# Patient Record
Sex: Male | Born: 1985 | Hispanic: No | Smoking: Former smoker
Health system: Southern US, Community
[De-identification: ages and names within clinical notes are randomized; demographics above are authoritative.]

## PROBLEM LIST (undated history)

## (undated) DIAGNOSIS — I749 Embolism and thrombosis of unspecified artery: Secondary | ICD-10-CM

## (undated) DIAGNOSIS — E079 Disorder of thyroid, unspecified: Secondary | ICD-10-CM

## (undated) DIAGNOSIS — D693 Immune thrombocytopenic purpura: Secondary | ICD-10-CM

## (undated) DIAGNOSIS — A4902 Methicillin resistant Staphylococcus aureus infection, unspecified site: Secondary | ICD-10-CM

## (undated) HISTORY — PX: NO PAST SURGERIES: SHX2092

---
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
## 2007-01-24 ENCOUNTER — Emergency Department (HOSPITAL_COMMUNITY): Admission: EM | Admit: 2007-01-24 | Discharge: 2007-01-24 | Payer: Self-pay | Admitting: Emergency Medicine

## 2007-04-16 DIAGNOSIS — A4902 Methicillin resistant Staphylococcus aureus infection, unspecified site: Secondary | ICD-10-CM

## 2007-04-16 HISTORY — DX: Methicillin resistant Staphylococcus aureus infection, unspecified site: A49.02

## 2007-04-20 ENCOUNTER — Emergency Department (HOSPITAL_COMMUNITY): Admission: EM | Admit: 2007-04-20 | Discharge: 2007-04-20 | Payer: Self-pay | Admitting: Emergency Medicine

## 2007-06-17 ENCOUNTER — Emergency Department (HOSPITAL_COMMUNITY): Admission: EM | Admit: 2007-06-17 | Discharge: 2007-06-17 | Payer: Self-pay | Admitting: Emergency Medicine

## 2009-12-10 ENCOUNTER — Emergency Department (HOSPITAL_COMMUNITY): Admission: EM | Admit: 2009-12-10 | Discharge: 2009-12-10 | Payer: Self-pay | Admitting: Emergency Medicine

## 2009-12-31 ENCOUNTER — Emergency Department (HOSPITAL_COMMUNITY): Admission: EM | Admit: 2009-12-31 | Discharge: 2009-12-31 | Payer: Self-pay | Admitting: Emergency Medicine

## 2010-02-22 ENCOUNTER — Emergency Department (HOSPITAL_COMMUNITY): Admission: EM | Admit: 2010-02-22 | Discharge: 2010-02-22 | Payer: Self-pay | Admitting: Emergency Medicine

## 2010-06-26 LAB — RAPID STREP SCREEN (MED CTR MEBANE ONLY): Streptococcus, Group A Screen (Direct): NEGATIVE

## 2010-06-28 LAB — DIFFERENTIAL
Basophils Absolute: 0.1 10*3/uL (ref 0.0–0.1)
Basophils Relative: 1 % (ref 0–1)
Eosinophils Absolute: 0.1 10*3/uL (ref 0.0–0.7)
Eosinophils Relative: 1 % (ref 0–5)
Lymphocytes Relative: 30 % (ref 12–46)
Lymphs Abs: 2.4 10*3/uL (ref 0.7–4.0)
Monocytes Absolute: 0.7 10*3/uL (ref 0.1–1.0)
Monocytes Relative: 9 % (ref 3–12)
Neutro Abs: 4.8 10*3/uL (ref 1.7–7.7)
Neutrophils Relative %: 59 % (ref 43–77)

## 2010-06-28 LAB — CBC
HCT: 38.2 % — ABNORMAL LOW (ref 39.0–52.0)
Hemoglobin: 12.6 g/dL — ABNORMAL LOW (ref 13.0–17.0)
MCH: 28.3 pg (ref 26.0–34.0)
MCHC: 32.9 g/dL (ref 30.0–36.0)
MCV: 85.9 fL (ref 78.0–100.0)
Platelets: 163 10*3/uL (ref 150–400)
RBC: 4.45 MIL/uL (ref 4.22–5.81)
RDW: 13.2 % (ref 11.5–15.5)
WBC: 8.1 10*3/uL (ref 4.0–10.5)

## 2010-06-28 LAB — POCT CARDIAC MARKERS
CKMB, poc: <1 ng/mL — ABNORMAL LOW (ref 1.0–8.0)
Myoglobin, poc: 95.8 ng/mL (ref 12–200)
Troponin i, poc: <0.05 ng/mL (ref 0.00–0.09)

## 2010-06-28 LAB — URINALYSIS, ROUTINE W REFLEX MICROSCOPIC
Bilirubin Urine: NEGATIVE
Glucose, UA: NEGATIVE mg/dL
Hgb urine dipstick: NEGATIVE
Ketones, ur: NEGATIVE mg/dL
Nitrite: NEGATIVE
Protein, ur: NEGATIVE mg/dL
Specific Gravity, Urine: 1.02 (ref 1.005–1.030)
Urobilinogen, UA: 0.2 mg/dL (ref 0.0–1.0)
pH: 7 (ref 5.0–8.0)

## 2010-06-28 LAB — RAPID URINE DRUG SCREEN, HOSP PERFORMED
Amphetamines: NOT DETECTED
Barbiturates: NOT DETECTED
Benzodiazepines: NOT DETECTED
Cocaine: NOT DETECTED
Opiates: POSITIVE — AB
Tetrahydrocannabinol: POSITIVE — AB

## 2010-06-28 LAB — BASIC METABOLIC PANEL
BUN: 8 mg/dL (ref 6–23)
CO2: 24 mEq/L (ref 19–32)
Calcium: 9.8 mg/dL (ref 8.4–10.5)
Chloride: 106 mEq/L (ref 96–112)
Creatinine, Ser: 1.17 mg/dL (ref 0.4–1.5)
GFR calc Af Amer: >60 mL/min (ref >60)
GFR calc non Af Amer: >60 mL/min (ref >60)
Glucose, Bld: 87 mg/dL (ref 70–99)
Potassium: 3.6 mEq/L (ref 3.5–5.1)
Sodium: 141 mEq/L (ref 135–145)

## 2010-06-28 LAB — RAPID STREP SCREEN (MED CTR MEBANE ONLY): Streptococcus, Group A Screen (Direct): NEGATIVE

## 2010-06-28 LAB — D-DIMER, QUANTITATIVE: D-Dimer, Quant: 1.06 ug/mL-FEU — ABNORMAL HIGH (ref 0.00–0.48)

## 2010-08-31 NOTE — Procedures (Signed)
   NAME:  Nathan Williamson, Nathan Williamson                           ACCOUNT NO.:  [id number]   MEDICAL RECORD NO.:  [id number]                   PATIENT TYPE:  OUT   LOCATION:  RAD                                  FACILITY:  [facility]   PHYSICIAN:  Vida Roller, M.D.                DATE OF BIRTH:  [birth year]   DATE OF PROCEDURE:  DATE OF DISCHARGE:                                  ECHOCARDIOGRAM   TAPE NUMBER:  [id number]   TAPE COUNT:  [id number]   CLINICAL INFORMATION:  This is an 25 year old with chest pain.   TECHNICAL QUALITY:  Excellent.   M-MODE TRACINGS:  1. The aorta is 28 mm.  2. The left atrium is 36 mm.  3. The septum is 9 mm.  4. The posterior wall is 10 mm.  5. The left ventricular diastolic dimension is 49 mm.  6. The left ventricular systolic dimension is 32 mm.   2-D AND DOPPLER IMAGING:  1. The left ventricle is normal size with normal systolic and diastolic     function.  No wall motion abnormalities are seen.  There is no evidence     of hypertrophy.  2. The right ventricle is normal size with normal systolic function.  No     wall motion abnormalities are seen.  3. Both atria are normal size. There is no evidence of an atrioseptal     defect.  4. The aortic valve is trileaflet and tricommisural with no evidence of     stenosis or regurgitation.  5. The mitral valve is morphologically unremarkable with no stenosis or     regurgitation.  6. The tricuspid valve is morphologically unremarkable with trace     insufficiency.  No stenosis is seen.  7. The pulmonic valve is morphologically unremarkable with trace     insufficiency.  No stenosis is seen.  8. The ascending aorta is normal.  The aortic arch is normal.  The     descending aorta to the limits of the study is normal.  9. The pericardial structures are normal.  10.      The inferior vena cava is normal size.      ___________________________________________                                            Vida Roller,  M.D.   JH/MEDQ  D:  01/05/2003  T:  01/05/2003  Job:  [id number]

## 2010-09-16 ENCOUNTER — Emergency Department (HOSPITAL_COMMUNITY)
Admission: EM | Admit: 2010-09-16 | Discharge: 2010-09-17 | Disposition: A | Discharge status: Home / Self Care | Payer: Self-pay | Attending: Emergency Medicine | Admitting: Emergency Medicine

## 2010-09-16 ENCOUNTER — Emergency Department (HOSPITAL_COMMUNITY): Payer: Self-pay

## 2010-09-16 DIAGNOSIS — IMO0002 Reserved for concepts with insufficient information to code with codable children: Secondary | ICD-10-CM | POA: Insufficient documentation

## 2010-09-16 DIAGNOSIS — M25469 Effusion, unspecified knee: Secondary | ICD-10-CM | POA: Insufficient documentation

## 2010-09-16 DIAGNOSIS — D693 Immune thrombocytopenic purpura: Secondary | ICD-10-CM | POA: Insufficient documentation

## 2010-09-16 DIAGNOSIS — J45909 Unspecified asthma, uncomplicated: Secondary | ICD-10-CM | POA: Insufficient documentation

## 2010-09-16 DIAGNOSIS — E039 Hypothyroidism, unspecified: Secondary | ICD-10-CM | POA: Insufficient documentation

## 2010-09-16 DIAGNOSIS — W1789XA Other fall from one level to another, initial encounter: Secondary | ICD-10-CM | POA: Insufficient documentation

## 2010-09-16 DIAGNOSIS — Z79899 Other long term (current) drug therapy: Secondary | ICD-10-CM | POA: Insufficient documentation

## 2010-10-29 ENCOUNTER — Emergency Department (HOSPITAL_COMMUNITY)
Admission: EM | Admit: 2010-10-29 | Discharge: 2010-10-30 | Disposition: A | Discharge status: Home / Self Care | Payer: Self-pay | Attending: Emergency Medicine | Admitting: Emergency Medicine

## 2010-10-29 ENCOUNTER — Emergency Department (HOSPITAL_COMMUNITY): Payer: Self-pay

## 2010-10-29 ENCOUNTER — Encounter: Payer: Self-pay | Admitting: *Deleted

## 2010-10-29 ENCOUNTER — Other Ambulatory Visit: Payer: Self-pay

## 2010-10-29 DIAGNOSIS — R0789 Other chest pain: Secondary | ICD-10-CM | POA: Insufficient documentation

## 2010-10-29 DIAGNOSIS — R0602 Shortness of breath: Secondary | ICD-10-CM | POA: Insufficient documentation

## 2010-10-29 DIAGNOSIS — Z86718 Personal history of other venous thrombosis and embolism: Secondary | ICD-10-CM | POA: Insufficient documentation

## 2010-10-29 DIAGNOSIS — R110 Nausea: Secondary | ICD-10-CM | POA: Insufficient documentation

## 2010-10-29 DIAGNOSIS — I499 Cardiac arrhythmia, unspecified: Secondary | ICD-10-CM | POA: Insufficient documentation

## 2010-10-29 DIAGNOSIS — R209 Unspecified disturbances of skin sensation: Secondary | ICD-10-CM | POA: Insufficient documentation

## 2010-10-29 DIAGNOSIS — F17200 Nicotine dependence, unspecified, uncomplicated: Secondary | ICD-10-CM | POA: Insufficient documentation

## 2010-10-29 DIAGNOSIS — R61 Generalized hyperhidrosis: Secondary | ICD-10-CM | POA: Insufficient documentation

## 2010-10-29 HISTORY — DX: Immune thrombocytopenic purpura: D69.3

## 2010-10-29 HISTORY — DX: Embolism and thrombosis of unspecified artery: I74.9

## 2010-10-29 HISTORY — DX: Disorder of thyroid, unspecified: E07.9

## 2010-10-29 LAB — CBC
HCT: 34.4 % — ABNORMAL LOW (ref 39.0–52.0)
MCHC: 33.4 g/dL (ref 30.0–36.0)
Platelets: 186 10*3/uL (ref 150–400)
RDW: 13.3 % (ref 11.5–15.5)
WBC: 6.3 10*3/uL (ref 4.0–10.5)

## 2010-10-29 LAB — COMPREHENSIVE METABOLIC PANEL
AST: 22 U/L (ref 0–37)
Albumin: 4 g/dL (ref 3.5–5.2)
Alkaline Phosphatase: 64 U/L (ref 39–117)
BUN: 14 mg/dL (ref 6–23)
Creatinine, Ser: 1.09 mg/dL (ref 0.50–1.35)
Potassium: 4.4 mEq/L (ref 3.5–5.1)
Total Protein: 7.4 g/dL (ref 6.0–8.3)

## 2010-10-29 LAB — RAPID URINE DRUG SCREEN, HOSP PERFORMED
Amphetamines: NOT DETECTED
Barbiturates: NOT DETECTED
Cocaine: NOT DETECTED
Opiates: NOT DETECTED
Tetrahydrocannabinol: POSITIVE — AB

## 2010-10-29 LAB — APTT: aPTT: 30 seconds (ref 24–37)

## 2010-10-29 LAB — PROTIME-INR: Prothrombin Time: 13.6 seconds (ref 11.6–15.2)

## 2010-10-29 MED ORDER — KETOROLAC TROMETHAMINE 30 MG/ML IJ SOLN
30.0000 mg | Freq: Once | INTRAMUSCULAR | Status: AC
  Administered 2010-10-29: 30 mg via INTRAVENOUS
  Filled 2010-10-29: qty 1

## 2010-10-29 MED ORDER — ONDANSETRON HCL 4 MG/2ML IJ SOLN
4.0000 mg | Freq: Four times a day (QID) | INTRAMUSCULAR | Status: DC | PRN
  Administered 2010-10-29: 4 mg via INTRAVENOUS
  Filled 2010-10-29: qty 2

## 2010-10-29 NOTE — ED Provider Notes (Signed)
History     Chief Complaint  Patient presents with  . Chest Pain    left arm numbness   Patient is a 25 y.o. male presenting with chest pain. The history is provided by the patient.  Chest Pain The chest pain began 1 - 2 hours ago. Chest pain occurs constantly. The chest pain is unchanged. The pain is associated with exertion and lifting (Pt reports moving furniture this afternoon when he became hot and chest pain began, pt went home and lied down but pain persisted.). The quality of the pain is described as sharp and squeezing (started as sharp pain but has become a squeezing pain). The pain does not radiate (pain does not radiate, but c/o numbness in his LUE). Exacerbated by: nothing. Primary symptoms include shortness of breath and nausea. Pertinent negatives for primary symptoms include no fever, no syncope, no cough, no wheezing, no palpitations, no abdominal pain, no vomiting and no dizziness.  Associated symptoms include diaphoresis and numbness.  Pertinent negatives for associated symptoms include no weakness. He tried nothing for the symptoms. Risk factors include smoking/tobacco exposure and male gender. Past medical history comments: Pt reports similar pain 3 months ago, seen in ED at that time, dx with "blood clot between heart and lungs" given med injection in his hip and d/c, no known blood thinners. ITP.     Past Medical History  Diagnosis Date  . Embolism - blood clot   . Thyroid disease   . ITP (idiopathic thrombocytopenic purpura)     History reviewed. No pertinent past surgical history.  History reviewed. No pertinent family history.  History  Substance Use Topics  . Smoking status: Current Everyday Smoker -- 0.5 packs/day    Types: Cigarettes  . Smokeless tobacco: Not on file  . Alcohol Use: No      Review of Systems  Constitutional: Positive for diaphoresis. Negative for fever.  Respiratory: Positive for shortness of breath. Negative for cough and wheezing.    Cardiovascular: Positive for chest pain. Negative for palpitations and syncope.  Gastrointestinal: Positive for nausea. Negative for vomiting and abdominal pain.  Neurological: Positive for numbness. Negative for dizziness and weakness.  All other systems reviewed and are negative.    Physical Exam  BP 125/66  Pulse 95  Temp(Src) 98.5 F (36.9 C) (Oral)  Resp 20  Ht 5' 9.5" (1.765 m)  Wt 180 lb (81.647 kg)  BMI 26.20 kg/m2  SpO2 100%  Physical Exam  Constitutional: He is oriented to person, place, and time. He appears well-developed and well-nourished.  Non-toxic appearance. He does not appear ill. No distress.  HENT:  Head: Normocephalic and atraumatic.  Right Ear: External ear normal.  Left Ear: External ear normal.  Nose: Nose normal. No mucosal edema or rhinorrhea.  Mouth/Throat: Oropharynx is clear and moist and mucous membranes are normal. No dental abscesses or uvula swelling.  Eyes: Conjunctivae and EOM are normal. Pupils are equal, round, and reactive to light.  Neck: Normal range of motion and full passive range of motion without pain. Neck supple.  Cardiovascular: Normal rate, regular rhythm and normal heart sounds.  Exam reveals no gallop and no friction rub.   No murmur heard. Pulmonary/Chest: Effort normal and breath sounds normal. No respiratory distress. He has no wheezes. He has no rhonchi. He has no rales. He exhibits no tenderness and no crepitus.  Abdominal: Soft. Normal appearance and bowel sounds are normal. He exhibits no distension. There is no tenderness. There is no rebound  and no guarding.  Musculoskeletal: Normal range of motion. He exhibits no edema and no tenderness.       Moves all extremities well.   Neurological: He is alert and oriented to person, place, and time. He has normal strength. No cranial nerve deficit.  Skin: Skin is warm, dry and intact. No rash noted. No erythema. No pallor.  Psychiatric: He has a normal mood and affect. His speech  is normal and behavior is normal. His mood appears not anxious.    ED Course  Procedures  Written by Enos Fling acting as scribe for Dr. Lynelle Doctor. I personally performed the services described in this documentation, which was scribed in my presence. The recorded information has been reviewed and considered. Devoria Albe, MD, FACEP  MDM   Date: 10/29/2010  Rate: 56  Rhythm: sinus bradycardia and sinus arrhythmia  QRS Axis: normal  Intervals: normal  ST/T Wave abnormalities: nonspecific ST/T changes  Conduction Disutrbances:none  Narrative Interpretation: early repolarization  Old EKG Reviewed: unchanged from 12/31/2009   Reviewed prior records pt had CT angio chest in Sept 2011 showing goiter, no PE and had a CT angio in 2004 that was negative for PE. PT had echo in May that was normal.   12:43 AM Pt insists he had a PE (blood clot between his heart and lung). States he had the tests done here. I reviewed canopy and he has had 3 CT angio chest one in 2011 and two in 2004 and all are neg for PE.   Pt given toradol for pain, states he is still hurting, will add muscle relaxer and let him go home.       Ward Givens, MD 10/30/10 [phone]

## 2010-10-29 NOTE — ED Notes (Signed)
Patient with Chest pain that started today and left arm numbness today, patient with hx of blood clot in chest-?PE-in April of this year

## 2010-10-30 MED ORDER — CYCLOBENZAPRINE HCL 10 MG PO TABS: 10.0000 mg | ORAL_TABLET | Freq: Three times a day (TID) | ORAL | Status: AC | PRN

## 2010-10-30 MED ORDER — TRAMADOL-ACETAMINOPHEN 37.5-325 MG PO TABS: 2.0000 | ORAL_TABLET | Freq: Four times a day (QID) | ORAL | Status: AC | PRN

## 2010-10-30 MED ORDER — CYCLOBENZAPRINE HCL 10 MG PO TABS
10.0000 mg | ORAL_TABLET | Freq: Once | ORAL | Status: AC
  Administered 2010-10-30: 10 mg via ORAL
  Filled 2010-10-30: qty 1

## 2010-10-30 MED ORDER — IBUPROFEN 600 MG PO TABS
600.0000 mg | ORAL_TABLET | Freq: Four times a day (QID) | ORAL | Status: AC | PRN
Start: 2010-10-30 — End: 2010-11-09

## 2010-10-30 NOTE — ED Notes (Signed)
Pt c/o pain; Dr. Lynelle Doctor made aware, no new orders given

## 2010-12-09 ENCOUNTER — Observation Stay (HOSPITAL_COMMUNITY)
Admission: EM | Admit: 2010-12-09 | Discharge: 2010-12-11 | Disposition: A | Discharge status: Home / Self Care | Payer: Self-pay | Attending: Internal Medicine | Admitting: Internal Medicine

## 2010-12-09 ENCOUNTER — Other Ambulatory Visit: Payer: Self-pay

## 2010-12-09 ENCOUNTER — Encounter (HOSPITAL_COMMUNITY): Payer: Self-pay | Admitting: Emergency Medicine

## 2010-12-09 ENCOUNTER — Emergency Department (HOSPITAL_COMMUNITY): Payer: Self-pay

## 2010-12-09 DIAGNOSIS — R079 Chest pain, unspecified: Secondary | ICD-10-CM | POA: Diagnosis present

## 2010-12-09 DIAGNOSIS — R001 Bradycardia, unspecified: Secondary | ICD-10-CM | POA: Diagnosis present

## 2010-12-09 DIAGNOSIS — R748 Abnormal levels of other serum enzymes: Secondary | ICD-10-CM | POA: Diagnosis present

## 2010-12-09 DIAGNOSIS — E010 Iodine-deficiency related diffuse (endemic) goiter: Secondary | ICD-10-CM

## 2010-12-09 DIAGNOSIS — R0789 Other chest pain: Principal | ICD-10-CM | POA: Insufficient documentation

## 2010-12-09 DIAGNOSIS — I498 Other specified cardiac arrhythmias: Secondary | ICD-10-CM | POA: Insufficient documentation

## 2010-12-09 DIAGNOSIS — E049 Nontoxic goiter, unspecified: Secondary | ICD-10-CM | POA: Insufficient documentation

## 2010-12-09 DIAGNOSIS — E876 Hypokalemia: Secondary | ICD-10-CM | POA: Insufficient documentation

## 2010-12-09 HISTORY — DX: Methicillin resistant Staphylococcus aureus infection, unspecified site: A49.02

## 2010-12-09 LAB — URINALYSIS, ROUTINE W REFLEX MICROSCOPIC
Glucose, UA: NEGATIVE mg/dL
Glucose, UA: NEGATIVE mg/dL
Hgb urine dipstick: NEGATIVE
Ketones, ur: 40 mg/dL — AB
Leukocytes, UA: NEGATIVE
Leukocytes, UA: NEGATIVE
Nitrite: NEGATIVE
Protein, ur: NEGATIVE mg/dL
Protein, ur: NEGATIVE mg/dL
Specific Gravity, Urine: 1.01 (ref 1.005–1.030)
pH: 7 (ref 5.0–8.0)
pH: 6 (ref 5.0–8.0)

## 2010-12-09 LAB — DIFFERENTIAL
Basophils Absolute: 0 10*3/uL (ref 0.0–0.1)
Basophils Relative: 0 % (ref 0–1)
Eosinophils Absolute: 0.1 10*3/uL (ref 0.0–0.7)
Eosinophils Relative: 2 % (ref 0–5)
Monocytes Absolute: 0.6 10*3/uL (ref 0.1–1.0)
Monocytes Relative: 9 % (ref 3–12)
Neutrophils Relative %: 53 % (ref 43–77)

## 2010-12-09 LAB — APTT: aPTT: 32 seconds (ref 24–37)

## 2010-12-09 LAB — CBC
Platelets: 210 10*3/uL (ref 150–400)
RBC: 4.09 MIL/uL — ABNORMAL LOW (ref 4.22–5.81)
WBC: 7.3 10*3/uL (ref 4.0–10.5)

## 2010-12-09 LAB — MRSA PCR SCREENING: MRSA by PCR: INVALID — AB

## 2010-12-09 LAB — COMPREHENSIVE METABOLIC PANEL
AST: 23 U/L (ref 0–37)
Albumin: 3.7 g/dL (ref 3.5–5.2)
BUN: 16 mg/dL (ref 6–23)
Calcium: 8.8 mg/dL (ref 8.4–10.5)
Creatinine, Ser: 0.93 mg/dL (ref 0.50–1.35)
Total Bilirubin: 0.6 mg/dL (ref 0.3–1.2)

## 2010-12-09 LAB — PROTIME-INR: INR: 1.08 (ref 0.00–1.49)

## 2010-12-09 LAB — CARDIAC PANEL(CRET KIN+CKTOT+MB+TROPI)
Relative Index: 4.7 — ABNORMAL HIGH (ref 0.0–2.5)
Total CK: 669 U/L — ABNORMAL HIGH (ref 7–232)

## 2010-12-09 LAB — LIPASE, BLOOD: Lipase: 99 U/L — ABNORMAL HIGH (ref 11–59)

## 2010-12-09 MED ORDER — IOHEXOL 350 MG/ML SOLN
100.0000 mL | Freq: Once | INTRAVENOUS | Status: AC | PRN
  Administered 2010-12-09: 100 mL via INTRAVENOUS

## 2010-12-09 MED ORDER — HYDROMORPHONE HCL 1 MG/ML IJ SOLN
0.5000 mg | INTRAMUSCULAR | Status: DC | PRN
  Administered 2010-12-10 (×2): 1 mg via INTRAVENOUS
  Administered 2010-12-10: 0.5 mg via INTRAVENOUS
  Filled 2010-12-09 (×4): qty 1

## 2010-12-09 MED ORDER — LEVOTHYROXINE SODIUM 75 MCG PO TABS
175.0000 ug | ORAL_TABLET | Freq: Every day | ORAL | Status: DC
  Administered 2010-12-10 – 2010-12-11 (×2): 175 ug via ORAL
  Filled 2010-12-09 (×2): qty 1

## 2010-12-09 MED ORDER — MORPHINE SULFATE 4 MG/ML IJ SOLN
4.0000 mg | Freq: Once | INTRAMUSCULAR | Status: AC
  Administered 2010-12-09: 4 mg via INTRAVENOUS
  Filled 2010-12-09: qty 1

## 2010-12-09 MED ORDER — SODIUM CHLORIDE 0.9 % IV SOLN: INTRAVENOUS | Status: DC

## 2010-12-09 MED ORDER — ONDANSETRON HCL 4 MG/2ML IJ SOLN: 4.0000 mg | Freq: Four times a day (QID) | INTRAMUSCULAR | Status: DC | PRN

## 2010-12-09 MED ORDER — ONDANSETRON HCL 4 MG/2ML IJ SOLN
4.0000 mg | Freq: Once | INTRAMUSCULAR | Status: AC
  Administered 2010-12-09: 4 mg via INTRAVENOUS
  Filled 2010-12-09: qty 2

## 2010-12-09 MED ORDER — ASPIRIN EC 81 MG PO TBEC
81.0000 mg | DELAYED_RELEASE_TABLET | Freq: Every day | ORAL | Status: DC
  Administered 2010-12-10 – 2010-12-11 (×2): 81 mg via ORAL
  Filled 2010-12-09 (×2): qty 1

## 2010-12-09 MED ORDER — POTASSIUM CHLORIDE IN NACL 20-0.9 MEQ/L-% IV SOLN
INTRAVENOUS | Status: DC
  Administered 2010-12-09 – 2010-12-11 (×5): via INTRAVENOUS

## 2010-12-09 MED ORDER — SODIUM CHLORIDE 0.9 % IV SOLN
Freq: Once | INTRAVENOUS | Status: AC
  Administered 2010-12-09: 23:00:00 via INTRAVENOUS

## 2010-12-09 MED ORDER — ENOXAPARIN SODIUM 40 MG/0.4ML ~~LOC~~ SOLN
40.0000 mg | SUBCUTANEOUS | Status: DC
  Administered 2010-12-09 – 2010-12-10 (×2): 40 mg via SUBCUTANEOUS
  Filled 2010-12-09 (×2): qty 0.4

## 2010-12-09 MED ORDER — ACETAMINOPHEN 650 MG RE SUPP: 650.0000 mg | Freq: Four times a day (QID) | RECTAL | Status: DC | PRN

## 2010-12-09 MED ORDER — POLYETHYLENE GLYCOL 3350 17 G PO PACK: 17.0000 g | PACK | Freq: Every day | ORAL | Status: DC | PRN

## 2010-12-09 MED ORDER — ONDANSETRON HCL 4 MG PO TABS: 4.0000 mg | ORAL_TABLET | Freq: Four times a day (QID) | ORAL | Status: DC | PRN

## 2010-12-09 MED ORDER — ACETAMINOPHEN 325 MG PO TABS: 650.0000 mg | ORAL_TABLET | Freq: Four times a day (QID) | ORAL | Status: DC | PRN

## 2010-12-09 MED ORDER — BISACODYL 10 MG RE SUPP: 10.0000 mg | RECTAL | Status: DC | PRN

## 2010-12-09 MED ORDER — FLEET ENEMA 7-19 GM/118ML RE ENEM: 1.0000 | ENEMA | RECTAL | Status: DC | PRN

## 2010-12-09 MED ORDER — TRAZODONE HCL 50 MG PO TABS: 25.0000 mg | ORAL_TABLET | Freq: Every evening | ORAL | Status: DC | PRN

## 2010-12-09 NOTE — ED Notes (Signed)
Pt advised of possible admission. Pt given a drink at this time.

## 2010-12-09 NOTE — ED Notes (Signed)
Pt remains on cardiac monitor w/ NIBP vital signs WNL. Bed in low position, side rails up x2. NAD noted & no needs voiced at this time. Pt states his chest pain is better at this time & rates his pain a 5/10.

## 2010-12-09 NOTE — ED Notes (Signed)
Pt states he woke up with chest pain midsternal around 10am. Also reports SOB since onset

## 2010-12-09 NOTE — ED Notes (Signed)
CRITICAL VALUE ALERT  Critical value received: mb 31.2  Date of notification:  12/09/2010   Time of notification: 19:01  Critical value read back: yes Nurse who received alert:  Juliette Alcide, RN   MD notified (1st page):  Dr. Lynelle Doctor   Time of first page: 19:02  MD notified (2nd page):  Time of second page:  Responding MD:    Time MD responded:

## 2010-12-09 NOTE — ED Provider Notes (Signed)
History     CSN: [id number] Arrival date & time: 12/09/2010  2:55 PM  Chief Complaint  Patient presents with  . Chest Pain   HPI Comments: The pain is located in the bilateral chest. It does increase with palpation and movement.  Patient is a 25 y.o. male presenting with chest pain. The history is provided by the patient.  Chest Pain Episode onset: today. Chest pain occurs constantly. The chest pain is unchanged. The severity of the pain is moderate. Quality: She feels like it's a grabbing in his chest. The pain does not radiate. Chest pain is worsened by certain positions. Primary symptoms include shortness of breath, nausea, vomiting and dizziness. Pertinent negatives for primary symptoms include no fever, no fatigue, no syncope, no cough and no wheezing.  The shortness of breath began today. The shortness of breath is mild. The patient's medical history does not include CHF, COPD, asthma or chronic lung disease.   Dizziness also occurs with nausea and vomiting. Dizziness does not occur with weakness or diaphoresis.   Pertinent negatives for associated symptoms include no diaphoresis, no numbness and no weakness.     Past Medical History  Diagnosis Date  . Embolism - blood clot   . Thyroid disease   . ITP (idiopathic thrombocytopenic purpura)     History reviewed. No pertinent past surgical history.  History reviewed. No pertinent family history.  History  Substance Use Topics  . Smoking status: Current Everyday Smoker -- 0.5 packs/day    Types: Cigarettes  . Smokeless tobacco: Not on file  . Alcohol Use: No      Review of Systems  Constitutional: Negative for fever, diaphoresis and fatigue.  Respiratory: Positive for shortness of breath. Negative for cough and wheezing.   Cardiovascular: Positive for chest pain. Negative for syncope.  Gastrointestinal: Positive for nausea and vomiting.  Neurological: Positive for dizziness. Negative for weakness and numbness.  All  other systems reviewed and are negative.    Physical Exam  BP 118/57  Pulse 45  Temp(Src) 98.5 F (36.9 C) (Oral)  Resp 18  SpO2 100%  Physical Exam  Nursing note and vitals reviewed. Constitutional: He appears well-developed and well-nourished. No distress.       Uncomfortable appearing  HENT:  Head: Normocephalic and atraumatic.  Right Ear: External ear normal.  Left Ear: External ear normal.  Eyes: Conjunctivae are normal. Right eye exhibits no discharge. Left eye exhibits no discharge. No scleral icterus.  Neck: Neck supple. No tracheal deviation present. Thyromegaly present.  Cardiovascular: Normal rate, regular rhythm and intact distal pulses.   Pulmonary/Chest: Effort normal and breath sounds normal. No stridor. No respiratory distress. He has no wheezes. He has no rales. He exhibits tenderness.  Abdominal: Soft. Bowel sounds are normal. He exhibits no distension. There is no tenderness. There is no rebound and no guarding.  Musculoskeletal: He exhibits no edema and no tenderness.  Neurological: He is alert. He has normal strength. No sensory deficit. Cranial nerve deficit:  no gross defecits noted. He exhibits normal muscle tone. He displays no seizure activity. Coordination normal.  Skin: Skin is warm and dry. No rash noted.  Psychiatric: He has a normal mood and affect.    ED Course  Procedures I reviewed his previous visit. Patient had an episode of chest pain as well the past and was diagnosed as osteoskeletal chest pain. It will work up. Drug screen was negative for cocaine use. Patient denies today any drug use as well.  No  results found.   Date: 12/09/2010  Rate: 44  Rhythm: sinus bradycardia  QRS Axis: right  Intervals: normal  ST/T Wave abnormalities: normal  Conduction Disutrbances:none  Narrative Interpretation: axis change  Old EKG Reviewed: changes noted , axis  change  Labs Reviewed  CBC - Abnormal; Notable for the following:    RBC 4.09 (*)     Hemoglobin 11.5 (*)    HCT 33.9 (*)    All other components within normal limits  COMPREHENSIVE METABOLIC PANEL - Abnormal; Notable for the following:    Potassium 3.4 (*)    All other components within normal limits  LIPASE, BLOOD - Abnormal; Notable for the following:    Lipase 99 (*)    All other components within normal limits  URINALYSIS, ROUTINE W REFLEX MICROSCOPIC - Abnormal; Notable for the following:    Bilirubin Urine SMALL (*)    Ketones, ur 40 (*)    All other components within normal limits  CARDIAC PANEL(CRET KIN+CKTOT+MB+TROPI) - Abnormal; Notable for the following:    Total CK 669 (*)    CK, MB 31.2 (*)    Relative Index 4.7 (*)    All other components within normal limits  D-DIMER, QUANTITATIVE - Abnormal; Notable for the following:    D-Dimer, Quant 0.50 (*)    All other components within normal limits  DIFFERENTIAL   Dg Chest 2 View  12/09/2010  *RADIOLOGY REPORT*  Clinical Data: Chest pain and shortness of breath.  CHEST - 2 VIEW  Comparison: PA and lateral chest 10/29/2010.  Findings: The lungs are clear.  Heart size is normal.  No pneumothorax or pleural effusion.  IMPRESSION: Normal chest.  Original Report Authenticated By: Bernadene Bell. Maricela Curet, M.D.   Ct Angio Chest W/cm &/or Wo Cm  12/09/2010  *RADIOLOGY REPORT*  Clinical Data:  Chest pain.  CT ANGIOGRAPHY CHEST WITH CONTRAST  Technique:  Multidetector CT imaging of the chest was performed using the standard protocol during bolus administration of intravenous contrast.  Multiplanar CT image reconstructions including MIPs were obtained to evaluate the vascular anatomy.  Contrast:  100 ml Omnipaque-300.  Comparison:  CT chest 12/31/2009 and 04/15/03.  Findings:  No pulmonary embolus is identified.  As seen on the most recent CT scan, the thyroid gland is massively enlarged.  No pleural effusion is identified.  There is cardiomegaly. Small amount of pericardial fluid is noted.  Contrast material refluxes into the  inferior vena cava. No lymphadenopathy.  Lungs are clear. Incidentally imaged upper abdomen demonstrates low attenuation of the liver.  No focal bony abnormality.  Review of the MIP images confirms the above findings.  IMPRESSION:  1.  Negative for pulmonary embolus. 2.  Marked enlargement of the thyroid gland diffusely compatible with goiter. 3.  Heart size appears mildly enlarged with some reflux of contrast material into the inferior vena cava suggesting right heart insufficiency. 4.  Low attenuation of the liver compatible with fatty change.  Original Report Authenticated By: Bernadene Bell. D'ALESSIO, M.D.   . MDM 4:45 PM patient with increased d-dimer and increase in his lipase we'll do a CT scan of his chest to rule out pulmonary embolus  Cardiac enzymes are back at this point patient has an elevated total CK with the increased relative index of CK-MB. Acute coronary artery disease is rather unlikely at his age although not impossible. And concern with the constellation of his symptoms of the possibility of a pericarditis/myocarditis although his EKG did not have any acute changes. At  this point all discussed with the medicine regarding admission for observation.  The case was discussed with Dr. Orvan Falconer who will see the patient in the ED.

## 2010-12-09 NOTE — H&P (Signed)
PCP:   No primary provider on file.   Chief Complaint:  Chest pain x this morning.  HPI: Woke this morning with left sided non-radiating sharp, squeezing chest pain and was repeatedly dizzy with tendency to fall when arising from bed. He had to lay flat until he could get someone to come to his assistance. He denies substance abuse or partying the night before; he denies nausea, vomiting , diarrhea, dysuria, frequency.  chest pain was helped by morphine in the ED.  Review of Systems:  The patient denies anorexia, fever, weight loss,, vision loss, decreased hearing, hoarseness, chest pain, syncope, dyspnea on exertion, peripheral edema, balance deficits, hemoptysis, abdominal pain, melena, hematochezia, severe indigestion/heartburn, hematuria, incontinence, genital sores, muscle weakness, suspicious skin lesions, transient blindness, difficulty walking, depression, unusual weight change, abnormal bleeding, enlarged lymph nodes, angioedema, and breast masses.  Past Medical History: Past Medical History  Diagnosis Date  . Embolism - blood clot   . Thyroid disease   . ITP (idiopathic thrombocytopenic purpura)   . MRSA infection (methicillin-resistant Staphylococcus aureus) 2009   History reviewed. No pertinent past surgical history.  Medications: Prior to Admission medications   Medication Sig Start Date End Date Taking? Authorizing Provider  levothyroxine (SYNTHROID, LEVOTHROID) 175 MCG tablet Take 175 mcg by mouth daily.    Yes Historical Provider, MD    Allergies:   Allergies  Allergen Reactions  . Penicillins Anaphylaxis    Social History:  reports that he has been smoking Cigarettes.  He has been smoking about .25 packs per day. He does not have any smokeless tobacco history on file. He reports that he does not drink alcohol or use illicit drugs.  Reports 1 pack of cigarettes last him 1 month.  Family History: History reviewed. No pertinent family history.  Physical  Exam: Filed Vitals:   12/09/10 2103 12/09/10 2233 12/09/10 2234 12/09/10 2235  BP: 115/71 113/66 117/73 118/66  Pulse: 54 54 54 80  Temp: 97.4 F (36.3 C)     TempSrc: Oral     Resp: 16 20 20 20   Height: 5\' 9"  (1.753 m)     Weight: 77.3 kg (170 lb 6.7 oz)     SpO2: 98% 97% 97% 99%   General appearance: alert and distracted Head: Normocephalic, without obvious abnormality, atraumatic Eyes: conjunctivae/corneas clear. PERRL, Throat: lips, mucosa, and tongue normal; teeth and gums normal Neck: Diffuse thyroid goiter.symmetric, no tenderness/mass/nodules Back: symmetric, no curvature. ROM normal. No CVA tenderness. Resp: clear to auscultation bilaterally Chest wall: no tenderness Cardio: regular rate and rhythm, S1, S2 normal, no murmur, click, rub or gallop GI: soft, non-tender; bowel sounds normal; no masses,  no organomegaly Extremities: extremities normal, atraumatic, no cyanosis or edema Pulses: 2+ and symmetric Skin: Skin color, texture, turgor normal;diffuse tinea versicolor Neurologic: Grossly normal   Labs on Admission:   [facility] 12/09/10 1550  NA 135  K 3.4*  CL 103  CO2 22  GLUCOSE 77  BUN 16  CREATININE 0.93  CALCIUM 8.8  MG --  PHOS --    Basename 12/09/10 1550  AST 23  ALT 25  ALKPHOS 68  BILITOT 0.6  PROT 6.6  ALBUMIN 3.7    Basename 12/09/10 1550  LIPASE 99*  AMYLASE --    Basename 12/09/10 1550  WBC 7.3  NEUTROABS 3.9  HGB 11.5*  HCT 33.9*  MCV 82.9  PLT 210    Basename 12/09/10 1550  CKTOTAL 669*  CKMB 31.2*  CKMBINDEX --  TROPONINI <0.30  EKG: initially showed Rightward axis, but was repeated with correct lead placement and now shows, sinus bradycardia, no ST segmenta changes; normal axis.  Radiological Exams on Admission: Dg Chest 2 View  12/09/2010  *RADIOLOGY REPORT*  Clinical Data: Chest pain and shortness of breath.  CHEST - 2 VIEW  Comparison: PA and lateral chest 10/29/2010.  Findings: The lungs are clear.  Heart  size is normal.  No pneumothorax or pleural effusion.  IMPRESSION: Normal chest.  Original Report Authenticated By: Bernadene Bell. Maricela Curet, M.D.   Ct Angio Chest W/cm &/or Wo Cm  12/09/2010  *RADIOLOGY REPORT*  Clinical Data:  Chest pain.  CT ANGIOGRAPHY CHEST WITH CONTRAST  Technique:  Multidetector CT imaging of the chest was performed using the standard protocol during bolus administration of intravenous contrast.  Multiplanar CT image reconstructions including MIPs were obtained to evaluate the vascular anatomy.  Contrast:  100 ml Omnipaque-300.  Comparison:  CT chest 12/31/2009 and 04/15/03.  Findings:  No pulmonary embolus is identified.  As seen on the most recent CT scan, the thyroid gland is massively enlarged.  No pleural effusion is identified.  There is cardiomegaly. Small amount of pericardial fluid is noted.  Contrast material refluxes into the inferior vena cava. No lymphadenopathy.  Lungs are clear. Incidentally imaged upper abdomen demonstrates low attenuation of the liver.  No focal bony abnormality.  Review of the MIP images confirms the above findings.  IMPRESSION:  1.  Negative for pulmonary embolus. 2.  Marked enlargement of the thyroid gland diffusely compatible with goiter. 3.  Heart size appears mildly enlarged with some reflux of contrast material into the inferior vena cava suggesting right heart insufficiency. 4.  Low attenuation of the liver compatible with fatty change.  Original Report Authenticated By: Bernadene Bell. Maricela Curet, M.D.    Assessment/Plan Present on Admission:  .Chest pain .Cardiac enzymes elevated .Bradycardia hypokalemia  H/o Hypothyroid goiter  H/o ITP Unclear h/o embolic disease  PLAN: Patient has seems to have difficulty focusing to answer questions during interview and this may be due to drug abuse , despite his denials, so will check a urine drug screen while we continue his cardiac work up. He may have Rhabdo due to cocaine of other ingestion; he may  have chest pain assoicated with acute Marijuana abuse, although this does not typically cause abnormal cardiac enzymes.  Pericarditis is a possibility but his EKG does not show typical features. He does have a h/o hypothyroidism and non-compliance may be clausen bradycardia and pericardial effusion. - will check TFTs  Will get 2D echo and consult cardiology,for his bradycardia, elevated enzymes and pericardial effusion  Other plans as per orders.  Zeidy Tayag 12/09/2010, 11:25 PM

## 2010-12-10 ENCOUNTER — Encounter (HOSPITAL_COMMUNITY): Payer: Self-pay | Admitting: Adult Health

## 2010-12-10 DIAGNOSIS — R079 Chest pain, unspecified: Secondary | ICD-10-CM

## 2010-12-10 DIAGNOSIS — R072 Precordial pain: Secondary | ICD-10-CM

## 2010-12-10 LAB — CARDIAC PANEL(CRET KIN+CKTOT+MB+TROPI)
CK, MB: 24 ng/mL (ref 0.3–4.0)
CK, MB: 28.7 ng/mL (ref 0.3–4.0)
Relative Index: 4.9 — ABNORMAL HIGH (ref 0.0–2.5)
Relative Index: 5 — ABNORMAL HIGH (ref 0.0–2.5)
Total CK: 485 U/L — ABNORMAL HIGH (ref 7–232)
Total CK: 570 U/L — ABNORMAL HIGH (ref 7–232)
Troponin I: <0.3 ng/mL (ref <0.30)
Troponin I: <0.3 ng/mL (ref <0.30)

## 2010-12-10 LAB — RAPID URINE DRUG SCREEN, HOSP PERFORMED: Benzodiazepines: NOT DETECTED

## 2010-12-10 LAB — CBC
HCT: 35.2 % — ABNORMAL LOW (ref 39.0–52.0)
MCV: 83.6 fL (ref 78.0–100.0)
Platelets: 193 10*3/uL (ref 150–400)
RBC: 4.21 MIL/uL — ABNORMAL LOW (ref 4.22–5.81)
RDW: 13.5 % (ref 11.5–15.5)
WBC: 7.1 10*3/uL (ref 4.0–10.5)

## 2010-12-10 LAB — TSH: TSH: 4.032 u[IU]/mL (ref 0.350–4.500)

## 2010-12-10 LAB — BASIC METABOLIC PANEL
CO2: 26 mEq/L (ref 19–32)
Chloride: 102 mEq/L (ref 96–112)
Creatinine, Ser: 1.1 mg/dL (ref 0.50–1.35)
GFR calc Af Amer: >60 mL/min (ref >60)
Potassium: 3.6 mEq/L (ref 3.5–5.1)
Sodium: 136 mEq/L (ref 135–145)

## 2010-12-10 LAB — HEMOGLOBIN A1C
Hgb A1c MFr Bld: 6 % — ABNORMAL HIGH (ref <5.7)
Mean Plasma Glucose: 126 mg/dL — ABNORMAL HIGH (ref <117)

## 2010-12-10 LAB — PHOSPHORUS: Phosphorus: 4.4 mg/dL (ref 2.3–4.6)

## 2010-12-10 LAB — MAGNESIUM: Magnesium: 1.8 mg/dL (ref 1.5–2.5)

## 2010-12-10 NOTE — Progress Notes (Signed)
UR Chart Review Completed  

## 2010-12-10 NOTE — Progress Notes (Addendum)
CRITICAL VALUE ALERT  Critical value received:  CKMB 24  Date of notification:  12/10/2010  Time of notification:  0630  Critical value read back: Yes  Nurse who received alert:  L. Christell Constant, RN  MD notified (1st page):  L. Orvan Falconer, MD  Time of first page:  [phone]  MD notified (2nd page):  Time of second page:  Responding MD:  Oralia Manis, MD [phone]  Time MD responded:  [phone] No orders given. MD knew of critical lab value.

## 2010-12-10 NOTE — Progress Notes (Signed)
*  PRELIMINARY RESULTS* Echocardiogram 2D Echocardiogram has been performed.  Conrad Brenee Gajda 12/10/2010, 10:07 AM

## 2010-12-10 NOTE — Consult Note (Signed)
CARDIOLOGY CONSULT NOTE  Patient ID: Nathan Williamson MRN: [id number] DOB/AGE: 1985-11-21 25 y.o.  Admit date: 12/09/2010 Referring Physician: Karilyn Cota Primary Physician: [facility] Aschoff. Primary Cardiologist:(New) Secilia Apps Reason for Consultation: Chest Pain  HPI: 25 y/o AAM with cardiac history we are asked to see at the request of the Hospitalist service for patient complaints of chest pain and dyspnea.  He has a history of ITP, hypothyroid with goiter, embolism (unknown site).  He was admitted after his mother found him sitting on his bedroom floor diaphoretic and very dyspnec. He was treated with morphine for chest pain and dyspnea and with zofran.  He admits to medical noncompliance with thyroid medications. He is found to be THC positive in urine drug screen.  He denies smoking this, but "was around people who were."  He is a difficult historian and is reluctant to answer questions.  Currently he is pain free and breathing better. CE are negative,  EKG is negative for ischemia. Chest CT demonstrated:" Heart size appears mildly enlarged with some reflux of contrast material into the inferior vena cava suggesting right heart insufficiency."  He denies edema.     Review of systems complete and found to be negative unless listed above  Past Medical History  Diagnosis Date  . Embolism - blood clot   . Thyroid disease   . ITP (idiopathic thrombocytopenic purpura)   . MRSA infection (methicillin-resistant Staphylococcus aureus) 2009    History reviewed. No pertinent family history.  History   Social History  . Marital Status: Legally Separated    Spouse Name: N/A    Number of Children: N/A  . Years of Education: N/A   Occupational History  . Unemployed    Social History Main Topics  . Smoking status: Current Everyday Smoker -- 0.2 packs/day    Types: Cigarettes  . Smokeless tobacco: Not on file  . Alcohol Use: Yes  . Drug Use: 2 per week    Special: Marijuana  .  Sexually Active: Not on file   Other Topics Concern  . Not on file   Social History Narrative   Unemployed. Lives at home with mother.States he cannot afford his medications.    History reviewed. No pertinent past surgical history.   Prescriptions prior to admission  Medication Sig Dispense Refill  . levothyroxine (SYNTHROID, LEVOTHROID) 175 MCG tablet Take 175 mcg by mouth daily.         Physical Exam: Blood pressure 97/58, pulse 61, temperature 97.6 F (36.4 C), temperature source Oral, resp. rate 20, height 5\' 9"  (1.753 m), weight 172 lb 6.4 oz (78.2 kg), SpO2 99.00%.  Lab Results  Component Value Date   WBC 7.1 12/10/2010   HGB 11.7* 12/10/2010   HCT 35.2* 12/10/2010   MCV 83.6 12/10/2010   PLT 193 12/10/2010     Lab 12/10/10 0512 12/09/10 1550  NA 136 --  K 3.6 --  CL 102 --  CO2 26 --  BUN 12 --  CREATININE 1.10 --  CALCIUM 8.2* --  PROT -- 6.6  BILITOT -- 0.6  ALKPHOS -- 68  ALT -- 25  AST -- 23  GLUCOSE 92 --   Lab Results  Component Value Date   CKTOTAL 485* 12/10/2010   CKMB 24.0* 12/10/2010   TROPONINI <0.30 12/10/2010    UDS: THC and Opiates  Endocrinology:  TSH 4.032 Hgb A1C 6.0   Radiology:CT Chest 1. Negative for pulmonary embolus. 2. Marked enlargement of the thyroid gland diffusely compatible  with goiter. 3. Heart size appears mildly enlarged with some reflux of contrast material into the inferior vena cava suggesting right heart insufficiency. 4. Low attenuation of the liver compatible with fatty change Chest X-Ray The lungs are clear. Heart size is normal. No pneumothorax or pleural effusion.  IMPRESSION: Normal chest.    EKG: Sinus bradycardia rate 62 bpm.  ASSESSMENT AND PLAN:   1. Atypical chest pain:  He has no EKG changes at this time. Negative cardiac enzymes.  Will review echo results for pericardial effusion as suggested by CT. His last echo was in 2004 in the setting of chest pain at that time and was found to be normal.    2.  Dyspnea:  He is positive for THC and wonder if bronchospasm may be playing a role in breathing status, as marijuana is known to do so. I will check PFT's to evaluate lung fx. 3. Hypothyroidism:  He is advised to take synthroid as directed.  May consider re-evaluation of goiter size to check if this is pressing on trachea affecting breathing status.  More recommendations per Dr. Dietrich Pates.  Thank you for consult. Signed: Bettey Mare. Lawrence NP 12/10/2010, 5:39 PM  Attending-Dr. Dietrich Pates Patient interviewed and examined. Additional history obtained from the patient's mother. Occasional episodes of chest pressure noted associated with dyspnea and diaphoresis. He perceives tachypalpitations during these spells, but no arrhythmia was documented in the emergency department although symptoms persisted. Examination is notable only for a minimal systolic murmur. Echocardiogram is normal except for mild dilatation of the IVC with possible elevated right heart pressure. Patient has a history of PE, but there is little to support that diagnosis at present. He could have pulmonary hypertension, but symptoms do not occur with exertion, and exercise capacity appears to be normal by history. Asthma could explain his symptoms, but there is little by history to support that diagnosis. We will proceed with stress testing and pulmonary function testing. If negative, as expected, we will follow him as an outpatient for additional symptoms that might assist at establishing a diagnosis.

## 2010-12-10 NOTE — Progress Notes (Signed)
Subjective: This 25 year old man was admitted yesterday with left-sided chest pain. He said the pain is better today. Serial cardiac enzymes are negative. Electrocardiogram is very unimpressive for evidence of myocarditis or pericarditis. He does have elevation of CPK which may reflect rhabdomyolysis. CT angiogram of the chest was negative for pulmonary embolism. There was a suggestion of a small pericardial effusion however.           Physical Exam: Blood pressure 97/58, pulse 61, temperature 97.6 F (36.4 C), temperature source Oral, resp. rate 20, height 5\' 9"  (1.753 m), weight 78.2 kg (172 lb 6.4 oz), SpO2 99.00%. He looks systemically well. Heart sounds are present and bradycardic. There are no murmurs. There is no pericardial rub with change in position of the patient. Lung fields are clear without any pleural rub. Abdomen is soft and nontender. He is alert and orientated without any focal neurological signs.   Investigations: Results for orders placed during the hospital encounter of 12/09/10 (from the past 48 hour(s))  CBC     Status: Abnormal   Collection Time   12/09/10  3:50 PM      Component Value Range Comment   WBC 7.3  4.0 - 10.5 (K/uL)    RBC 4.09 (*) 4.22 - 5.81 (MIL/uL)    Hemoglobin 11.5 (*) 13.0 - 17.0 (g/dL)    HCT 40.9 (*) 81.1 - 52.0 (%)    MCV 82.9  78.0 - 100.0 (fL)    MCH 28.1  26.0 - 34.0 (pg)    MCHC 33.9  30.0 - 36.0 (g/dL)    RDW 91.4  78.2 - 95.6 (%)    Platelets 210  150 - 400 (K/uL)   DIFFERENTIAL     Status: Normal   Collection Time   12/09/10  3:50 PM      Component Value Range Comment   Neutrophils Relative 53  43 - 77 (%)    Neutro Abs 3.9  1.7 - 7.7 (K/uL)    Lymphocytes Relative 36  12 - 46 (%)    Lymphs Abs 2.6  0.7 - 4.0 (K/uL)    Monocytes Relative 9  3 - 12 (%)    Monocytes Absolute 0.6  0.1 - 1.0 (K/uL)    Eosinophils Relative 2  0 - 5 (%)    Eosinophils Absolute 0.1  0.0 - 0.7 (K/uL)    Basophils Relative 0  0 - 1 (%)    Basophils Absolute 0.0  0.0 - 0.1 (K/uL)   COMPREHENSIVE METABOLIC PANEL     Status: Abnormal   Collection Time   12/09/10  3:50 PM      Component Value Range Comment   Sodium 135  135 - 145 (mEq/L)    Potassium 3.4 (*) 3.5 - 5.1 (mEq/L)    Chloride 103  96 - 112 (mEq/L)    CO2 22  19 - 32 (mEq/L)    Glucose, Bld 77  70 - 99 (mg/dL)    BUN 16  6 - 23 (mg/dL)    Creatinine, Ser 2.13  0.50 - 1.35 (mg/dL)    Calcium 8.8  8.4 - 10.5 (mg/dL)    Total Protein 6.6  6.0 - 8.3 (g/dL)    Albumin 3.7  3.5 - 5.2 (g/dL)    AST 23  0 - 37 (U/L)    ALT 25  0 - 53 (U/L)    Alkaline Phosphatase 68  39 - 117 (U/L)    Total Bilirubin 0.6  0.3 - 1.2 (mg/dL)  GFR calc non Af Amer >60  >60 (mL/min)    GFR calc Af Amer >60  >60 (mL/min)   LIPASE, BLOOD     Status: Abnormal   Collection Time   12/09/10  3:50 PM      Component Value Range Comment   Lipase 99 (*) 11 - 59 (U/L)   CARDIAC PANEL(CRET KIN+CKTOT+MB+TROPI)     Status: Abnormal   Collection Time   12/09/10  3:50 PM      Component Value Range Comment   Total CK 669 (*) 7 - 232 (U/L)    CK, MB 31.2 (*) 0.3 - 4.0 (ng/mL)    Troponin I <0.30  <0.30 (ng/mL)    Relative Index 4.7 (*) 0.0 - 2.5    D-DIMER, QUANTITATIVE     Status: Abnormal   Collection Time   12/09/10  3:50 PM      Component Value Range Comment   D-Dimer, Quant 0.50 (*) 0.00 - 0.48 (ug/mL-FEU)   APTT     Status: Normal   Collection Time   12/09/10  3:50 PM      Component Value Range Comment   aPTT 32  24 - 37 (seconds)   PROTIME-INR     Status: Normal   Collection Time   12/09/10  3:50 PM      Component Value Range Comment   Prothrombin Time 14.2  11.6 - 15.2 (seconds)    INR 1.08  0.00 - 1.49    URINALYSIS, ROUTINE W REFLEX MICROSCOPIC     Status: Abnormal   Collection Time   12/09/10  4:40 PM      Component Value Range Comment   Color, Urine YELLOW  YELLOW     Appearance CLEAR  CLEAR     Specific Gravity, Urine 1.025  1.005 - 1.030     pH 7.0  5.0 - 8.0     Glucose,  UA NEGATIVE  NEGATIVE (mg/dL)    Hgb urine dipstick NEGATIVE  NEGATIVE     Bilirubin Urine SMALL (*) NEGATIVE     Ketones, ur 40 (*) NEGATIVE (mg/dL)    Protein, ur NEGATIVE  NEGATIVE (mg/dL)    Urobilinogen, UA 1.0  0.0 - 1.0 (mg/dL)    Nitrite NEGATIVE  NEGATIVE     Leukocytes, UA NEGATIVE  NEGATIVE  MICROSCOPIC NOT DONE ON URINES WITH NEGATIVE PROTEIN, BLOOD, LEUKOCYTES, NITRITE, OR GLUCOSE <1000 mg/dL.  MRSA PCR SCREENING     Status: Abnormal   Collection Time   12/09/10  9:07 PM      Component Value Range Comment   MRSA by PCR INVALID RESULTS, SPECIMEN SENT FOR CULTURE (*) NEGATIVE    URINE RAPID DRUG SCREEN (HOSP PERFORMED)     Status: Abnormal   Collection Time   12/09/10 10:28 PM      Component Value Range Comment   Opiates POSITIVE (*) NONE DETECTED     Cocaine NONE DETECTED  NONE DETECTED     Benzodiazepines NONE DETECTED  NONE DETECTED     Amphetamines NONE DETECTED  NONE DETECTED     Tetrahydrocannabinol POSITIVE (*) NONE DETECTED     Barbiturates NONE DETECTED  NONE DETECTED    URINALYSIS, ROUTINE W REFLEX MICROSCOPIC     Status: Normal   Collection Time   12/09/10 10:28 PM      Component Value Range Comment   Color, Urine YELLOW  YELLOW     Appearance CLEAR  CLEAR     Specific Gravity, Urine 1.010  1.005 - 1.030     pH 6.0  5.0 - 8.0     Glucose, UA NEGATIVE  NEGATIVE (mg/dL)    Hgb urine dipstick NEGATIVE  NEGATIVE     Bilirubin Urine NEGATIVE  NEGATIVE     Ketones, ur NEGATIVE  NEGATIVE (mg/dL)    Protein, ur NEGATIVE  NEGATIVE (mg/dL)    Urobilinogen, UA 0.2  0.0 - 1.0 (mg/dL)    Nitrite NEGATIVE  NEGATIVE     Leukocytes, UA NEGATIVE  NEGATIVE  MICROSCOPIC NOT DONE ON URINES WITH NEGATIVE PROTEIN, BLOOD, LEUKOCYTES, NITRITE, OR GLUCOSE <1000 mg/dL.  CARDIAC PANEL(CRET KIN+CKTOT+MB+TROPI)     Status: Abnormal   Collection Time   12/09/10 11:57 PM      Component Value Range Comment   Total CK 570 (*) 7 - 232 (U/L)    CK, MB 28.7 (*) 0.3 - 4.0 (ng/mL)     Troponin I <0.30  <0.30 (ng/mL)    Relative Index 5.0 (*) 0.0 - 2.5    MAGNESIUM     Status: Normal   Collection Time   12/10/10  5:12 AM      Component Value Range Comment   Magnesium 1.8  1.5 - 2.5 (mg/dL)   PHOSPHORUS     Status: Normal   Collection Time   12/10/10  5:12 AM      Component Value Range Comment   Phosphorus 4.4  2.3 - 4.6 (mg/dL)   BASIC METABOLIC PANEL     Status: Abnormal   Collection Time   12/10/10  5:12 AM      Component Value Range Comment   Sodium 136  135 - 145 (mEq/L)    Potassium 3.6  3.5 - 5.1 (mEq/L)    Chloride 102  96 - 112 (mEq/L)    CO2 26  19 - 32 (mEq/L)    Glucose, Bld 92  70 - 99 (mg/dL)    BUN 12  6 - 23 (mg/dL)    Creatinine, Ser 4.78  0.50 - 1.35 (mg/dL)    Calcium 8.2 (*) 8.4 - 10.5 (mg/dL)    GFR calc non Af Amer >60  >60 (mL/min)    GFR calc Af Amer >60  >60 (mL/min)   CBC     Status: Abnormal   Collection Time   12/10/10  5:12 AM      Component Value Range Comment   WBC 7.1  4.0 - 10.5 (K/uL)    RBC 4.21 (*) 4.22 - 5.81 (MIL/uL)    Hemoglobin 11.7 (*) 13.0 - 17.0 (g/dL)    HCT 29.5 (*) 62.1 - 52.0 (%)    MCV 83.6  78.0 - 100.0 (fL)    MCH 27.8  26.0 - 34.0 (pg)    MCHC 33.2  30.0 - 36.0 (g/dL)    RDW 30.8  65.7 - 84.6 (%)    Platelets 193  150 - 400 (K/uL)   CARDIAC PANEL(CRET KIN+CKTOT+MB+TROPI)     Status: Abnormal   Collection Time   12/10/10  6:00 AM      Component Value Range Comment   Total CK 485 (*) 7 - 232 (U/L)    CK, MB 24.0 (*) 0.3 - 4.0 (ng/mL)    Troponin I <0.30  <0.30 (ng/mL)    Relative Index 4.9 (*) 0.0 - 2.5     Recent Results (from the past 240 hour(s))  MRSA PCR SCREENING     Status: Abnormal   Collection Time   12/09/10  9:07 PM  Component Value Range Status Comment   MRSA by PCR INVALID RESULTS, SPECIMEN SENT FOR CULTURE (*) NEGATIVE  Final     Dg Chest 2 View  12/09/2010  *RADIOLOGY REPORT*  Clinical Data: Chest pain and shortness of breath.  CHEST - 2 VIEW  Comparison: PA and lateral chest  10/29/2010.  Findings: The lungs are clear.  Heart size is normal.  No pneumothorax or pleural effusion.  IMPRESSION: Normal chest.  Original Report Authenticated By: Bernadene Bell. Maricela Curet, M.D.   Ct Angio Chest W/cm &/or Wo Cm  12/09/2010  *RADIOLOGY REPORT*  Clinical Data:  Chest pain.  CT ANGIOGRAPHY CHEST WITH CONTRAST  Technique:  Multidetector CT imaging of the chest was performed using the standard protocol during bolus administration of intravenous contrast.  Multiplanar CT image reconstructions including MIPs were obtained to evaluate the vascular anatomy.  Contrast:  100 ml Omnipaque-300.  Comparison:  CT chest 12/31/2009 and 04/15/03.  Findings:  No pulmonary embolus is identified.  As seen on the most recent CT scan, the thyroid gland is massively enlarged.  No pleural effusion is identified.  There is cardiomegaly. Small amount of pericardial fluid is noted.  Contrast material refluxes into the inferior vena cava. No lymphadenopathy.  Lungs are clear. Incidentally imaged upper abdomen demonstrates low attenuation of the liver.  No focal bony abnormality.  Review of the MIP images confirms the above findings.  IMPRESSION:  1.  Negative for pulmonary embolus. 2.  Marked enlargement of the thyroid gland diffusely compatible with goiter. 3.  Heart size appears mildly enlarged with some reflux of contrast material into the inferior vena cava suggesting right heart insufficiency. 4.  Low attenuation of the liver compatible with fatty change.  Original Report Authenticated By: Bernadene Bell. Maricela Curet, M.D.      Medications: I have reviewed the patient's current medications.  Impression: 1. Left-sided chest pain, unclear etiology. Likely musculoskeletal. 2. Sinus bradycardia. 3. Goiter with hypothyroidism. 4. Possible small pericardial effusion, unclear significance.     Plan: 1. Continue with current treatment. 2. Await cardiology opinion.     LOS: 1 day   Hanh Kertesz C 12/10/2010,  10:01 AM

## 2010-12-11 LAB — URINE CULTURE
Colony Count: NO GROWTH
Culture  Setup Time: 201208280054
Culture: NO GROWTH

## 2010-12-11 MED ORDER — OXYCODONE HCL 5 MG PO TABS: 5.0000 mg | ORAL_TABLET | ORAL | Status: AC | PRN

## 2010-12-11 NOTE — Progress Notes (Signed)
Discharge instructions and pharmacy script given to pt.  Pt verbalized understanding of all instructions.  Pt informed about Pul. Function test scheduled for 3 pm tomorrow at [facility].  IV to Lt ant forearm removed with cath tip in place and clean dressing applied.  Pt discharged home with family members. Nathan Williamson

## 2010-12-11 NOTE — Discharge Summary (Signed)
Physician Discharge Summary  Patient ID: Nathan Williamson MRN: [id number] DOB/AGE: 1986/01/15 25 y.o.  Admit date: 12/09/2010 Discharge date: 12/11/2010    Discharge Diagnoses:  1. Atypical chest pain. 2. Sinus bradycardia.   Current Discharge Medication List    START taking these medications   Details  oxyCODONE (ROXICODONE) 5 MG immediate release tablet Take 1 tablet (5 mg total) by mouth every 4 (four) hours as needed for pain. Qty: 30 tablet, Refills: 0      CONTINUE these medications which have NOT CHANGED   Details  levothyroxine (SYNTHROID, LEVOTHROID) 175 MCG tablet Take 175 mcg by mouth daily.         Discharged Condition: Stable and improved.    Consults: Cardiology, Dr. Dietrich Pates.  Significant Diagnostic Studies: Dg Chest 2 View  12/09/2010  *RADIOLOGY REPORT*  Clinical Data: Chest pain and shortness of breath.  CHEST - 2 VIEW  Comparison: PA and lateral chest 10/29/2010.  Findings: The lungs are clear.  Heart size is normal.  No pneumothorax or pleural effusion.  IMPRESSION: Normal chest.  Original Report Authenticated By: Nathan Williamson. Nathan Williamson, M.D.   Ct Angio Chest W/cm &/or Wo Cm  12/09/2010  *RADIOLOGY REPORT*  Clinical Data:  Chest pain.  CT ANGIOGRAPHY CHEST WITH CONTRAST  Technique:  Multidetector CT imaging of the chest was performed using the standard protocol during bolus administration of intravenous contrast.  Multiplanar CT image reconstructions including MIPs were obtained to evaluate the vascular anatomy.  Contrast:  100 ml Omnipaque-300.  Comparison:  CT chest 12/31/2009 and 04/15/03.  Findings:  No pulmonary embolus is identified.  As seen on the most recent CT scan, the thyroid gland is massively enlarged.  No pleural effusion is identified.  There is cardiomegaly. Small amount of pericardial fluid is noted.  Contrast material refluxes into the inferior vena cava. No lymphadenopathy.  Lungs are clear. Incidentally imaged upper abdomen demonstrates low  attenuation of the liver.  No focal bony abnormality.  Review of the MIP images confirms the above findings.  IMPRESSION:  1.  Negative for pulmonary embolus. 2.  Marked enlargement of the thyroid gland diffusely compatible with goiter. 3.  Heart size appears mildly enlarged with some reflux of contrast material into the inferior vena cava suggesting right heart insufficiency. 4.  Low attenuation of the liver compatible with fatty change.  Original Report Authenticated By: Nathan Williamson. Nathan Williamson, M.D.     2D Echocardiogram      Ordering Physician:  Nathan Williamson  Order# [id number]  Study Date:  12/10/10      Patient Information      Name  MRN   Description     Nathan Williamson  [id number]   25 year old Male              Result Narrative     *[facility]* [address] [place], [place] [address] [phone]  -------------------------------------------------------------------- Transthoracic Echocardiography  Patient: Nathan, Williamson MR #: [id number] Study Date: 12/10/2010 Gender: M Age: 41 Height: 175.3cm Weight: 78kg BSA: 1.53m^2 Pt. Status: Room: A304  SONOGRAPHER Nathan Williamson ATTENDING Nathan Williamson ADMITTING Nathan Williamson REFERRING Nathan Williamson PERFORMING Nathan Williamson [facility] cc:  -------------------------------------------------------------------- LV EF: 55% - 60%  -------------------------------------------------------------------- Indications: Chest pain 786.51.  -------------------------------------------------------------------- History: PMH: Dyspnea. PMH: Bradycardia, pericardial effusion Risk factors: Family history of coronary artery disease. Current tobacco use.  -------------------------------------------------------------------- Study Conclusions  - Left ventricle: The cavity size was normal. Wall thickness was normal. Systolic function  was normal. The estimated ejection fraction was in the range of 55% to  60%. Wall motion was normal; there were no regional wall motion abnormalities. - Atrial septum: No defect or patent foramen ovale was identified. Transthoracic echocardiography. M-mode, complete 2D, spectral Doppler, and color Doppler. Height: Height: 175.3cm. Height: 69in. Weight: Weight: 78kg. Weight: 171.6lb. Body mass index: BMI: 25.4kg/m^2. Body surface area: BSA: 1.48m^2. Patient status: Inpatient. Location: Bedside.       Lab Results: Results for orders placed during the hospital encounter of 12/09/10 (from the past 48 hour(s))  CBC     Status: Abnormal   Collection Time   12/09/10  3:50 PM      Component Value Range Comment   WBC 7.3  4.0 - 10.5 (K/uL)    RBC 4.09 (*) 4.22 - 5.81 (MIL/uL)    Hemoglobin 11.5 (*) 13.0 - 17.0 (g/dL)    HCT 19.1 (*) 47.8 - 52.0 (%)    MCV 82.9  78.0 - 100.0 (fL)    MCH 28.1  26.0 - 34.0 (pg)    MCHC 33.9  30.0 - 36.0 (g/dL)    RDW 29.5  62.1 - 30.8 (%)    Platelets 210  150 - 400 (K/uL)   DIFFERENTIAL     Status: Normal   Collection Time   12/09/10  3:50 PM      Component Value Range Comment   Neutrophils Relative 53  43 - 77 (%)    Neutro Abs 3.9  1.7 - 7.7 (K/uL)    Lymphocytes Relative 36  12 - 46 (%)    Lymphs Abs 2.6  0.7 - 4.0 (K/uL)    Monocytes Relative 9  3 - 12 (%)    Monocytes Absolute 0.6  0.1 - 1.0 (K/uL)    Eosinophils Relative 2  0 - 5 (%)    Eosinophils Absolute 0.1  0.0 - 0.7 (K/uL)    Basophils Relative 0  0 - 1 (%)    Basophils Absolute 0.0  0.0 - 0.1 (K/uL)   COMPREHENSIVE METABOLIC PANEL     Status: Abnormal   Collection Time   12/09/10  3:50 PM      Component Value Range Comment   Sodium 135  135 - 145 (mEq/L)    Potassium 3.4 (*) 3.5 - 5.1 (mEq/L)    Chloride 103  96 - 112 (mEq/L)    CO2 22  19 - 32 (mEq/L)    Glucose, Bld 77  70 - 99 (mg/dL)    BUN 16  6 - 23 (mg/dL)    Creatinine, Ser 6.57  0.50 - 1.35 (mg/dL)    Calcium 8.8  8.4 - 10.5 (mg/dL)    Total Protein 6.6  6.0 - 8.3 (g/dL)    Albumin 3.7  3.5  - 5.2 (g/dL)    AST 23  0 - 37 (U/L)    ALT 25  0 - 53 (U/L)    Alkaline Phosphatase 68  39 - 117 (U/L)    Total Bilirubin 0.6  0.3 - 1.2 (mg/dL)    GFR calc non Af Amer >60  >60 (mL/min)    GFR calc Af Amer >60  >60 (mL/min)   LIPASE, BLOOD     Status: Abnormal   Collection Time   12/09/10  3:50 PM      Component Value Range Comment   Lipase 99 (*) 11 - 59 (U/L)   CARDIAC PANEL(CRET KIN+CKTOT+MB+TROPI)     Status: Abnormal   Collection Time  12/09/10  3:50 PM      Component Value Range Comment   Total CK 669 (*) 7 - 232 (U/L)    CK, MB 31.2 (*) 0.3 - 4.0 (ng/mL)    Troponin I <0.30  <0.30 (ng/mL)    Relative Index 4.7 (*) 0.0 - 2.5    D-DIMER, QUANTITATIVE     Status: Abnormal   Collection Time   12/09/10  3:50 PM      Component Value Range Comment   D-Dimer, Quant 0.50 (*) 0.00 - 0.48 (ug/mL-FEU)   APTT     Status: Normal   Collection Time   12/09/10  3:50 PM      Component Value Range Comment   aPTT 32  24 - 37 (seconds)   PROTIME-INR     Status: Normal   Collection Time   12/09/10  3:50 PM      Component Value Range Comment   Prothrombin Time 14.2  11.6 - 15.2 (seconds)    INR 1.08  0.00 - 1.49    TSH     Status: Normal   Collection Time   12/09/10  3:50 PM      Component Value Range Comment   TSH 4.032  0.350 - 4.500 (uIU/mL)   HEMOGLOBIN A1C     Status: Abnormal   Collection Time   12/09/10  3:50 PM      Component Value Range Comment   Hemoglobin A1C 6.0 (*) <5.7 (%)    Mean Plasma Glucose 126 (*) <117 (mg/dL)   URINALYSIS, ROUTINE W REFLEX MICROSCOPIC     Status: Abnormal   Collection Time   12/09/10  4:40 PM      Component Value Range Comment   Color, Urine YELLOW  YELLOW     Appearance CLEAR  CLEAR     Specific Gravity, Urine 1.025  1.005 - 1.030     pH 7.0  5.0 - 8.0     Glucose, UA NEGATIVE  NEGATIVE (mg/dL)    Hgb urine dipstick NEGATIVE  NEGATIVE     Bilirubin Urine SMALL (*) NEGATIVE     Ketones, ur 40 (*) NEGATIVE (mg/dL)    Protein, ur NEGATIVE   NEGATIVE (mg/dL)    Urobilinogen, UA 1.0  0.0 - 1.0 (mg/dL)    Nitrite NEGATIVE  NEGATIVE     Leukocytes, UA NEGATIVE  NEGATIVE  MICROSCOPIC NOT DONE ON URINES WITH NEGATIVE PROTEIN, BLOOD, LEUKOCYTES, NITRITE, OR GLUCOSE <1000 mg/dL.  MRSA PCR SCREENING     Status: Abnormal   Collection Time   12/09/10  9:07 PM      Component Value Range Comment   MRSA by PCR INVALID RESULTS, SPECIMEN SENT FOR CULTURE (*) NEGATIVE    MRSA CULTURE     Status: Normal (Preliminary result)   Collection Time   12/09/10  9:07 PM      Component Value Range Comment   Specimen Description NOSE      Special Requests NONE      Culture NO GROWTH      Report Status PENDING     URINE RAPID DRUG SCREEN (HOSP PERFORMED)     Status: Abnormal   Collection Time   12/09/10 10:28 PM      Component Value Range Comment   Opiates POSITIVE (*) NONE DETECTED     Cocaine NONE DETECTED  NONE DETECTED     Benzodiazepines NONE DETECTED  NONE DETECTED     Amphetamines NONE DETECTED  NONE DETECTED  Tetrahydrocannabinol POSITIVE (*) NONE DETECTED     Barbiturates NONE DETECTED  NONE DETECTED    URINALYSIS, ROUTINE W REFLEX MICROSCOPIC     Status: Normal   Collection Time   12/09/10 10:28 PM      Component Value Range Comment   Color, Urine YELLOW  YELLOW     Appearance CLEAR  CLEAR     Specific Gravity, Urine 1.010  1.005 - 1.030     pH 6.0  5.0 - 8.0     Glucose, UA NEGATIVE  NEGATIVE (mg/dL)    Hgb urine dipstick NEGATIVE  NEGATIVE     Bilirubin Urine NEGATIVE  NEGATIVE     Ketones, ur NEGATIVE  NEGATIVE (mg/dL)    Protein, ur NEGATIVE  NEGATIVE (mg/dL)    Urobilinogen, UA 0.2  0.0 - 1.0 (mg/dL)    Nitrite NEGATIVE  NEGATIVE     Leukocytes, UA NEGATIVE  NEGATIVE  MICROSCOPIC NOT DONE ON URINES WITH NEGATIVE PROTEIN, BLOOD, LEUKOCYTES, NITRITE, OR GLUCOSE <1000 mg/dL.  CARDIAC PANEL(CRET KIN+CKTOT+MB+TROPI)     Status: Abnormal   Collection Time   12/09/10 11:57 PM      Component Value Range Comment   Total CK 570 (*)  7 - 232 (U/L)    CK, MB 28.7 (*) 0.3 - 4.0 (ng/mL)    Troponin I <0.30  <0.30 (ng/mL)    Relative Index 5.0 (*) 0.0 - 2.5    MAGNESIUM     Status: Normal   Collection Time   12/10/10  5:12 AM      Component Value Range Comment   Magnesium 1.8  1.5 - 2.5 (mg/dL)   PHOSPHORUS     Status: Normal   Collection Time   12/10/10  5:12 AM      Component Value Range Comment   Phosphorus 4.4  2.3 - 4.6 (mg/dL)   BASIC METABOLIC PANEL     Status: Abnormal   Collection Time   12/10/10  5:12 AM      Component Value Range Comment   Sodium 136  135 - 145 (mEq/L)    Potassium 3.6  3.5 - 5.1 (mEq/L)    Chloride 102  96 - 112 (mEq/L)    CO2 26  19 - 32 (mEq/L)    Glucose, Bld 92  70 - 99 (mg/dL)    BUN 12  6 - 23 (mg/dL)    Creatinine, Ser 4.09  0.50 - 1.35 (mg/dL)    Calcium 8.2 (*) 8.4 - 10.5 (mg/dL)    GFR calc non Af Amer >60  >60 (mL/min)    GFR calc Af Amer >60  >60 (mL/min)   CBC     Status: Abnormal   Collection Time   12/10/10  5:12 AM      Component Value Range Comment   WBC 7.1  4.0 - 10.5 (K/uL)    RBC 4.21 (*) 4.22 - 5.81 (MIL/uL)    Hemoglobin 11.7 (*) 13.0 - 17.0 (g/dL)    HCT 81.1 (*) 91.4 - 52.0 (%)    MCV 83.6  78.0 - 100.0 (fL)    MCH 27.8  26.0 - 34.0 (pg)    MCHC 33.2  30.0 - 36.0 (g/dL)    RDW 78.2  95.6 - 21.3 (%)    Platelets 193  150 - 400 (K/uL)   CARDIAC PANEL(CRET KIN+CKTOT+MB+TROPI)     Status: Abnormal   Collection Time   12/10/10  6:00 AM      Component Value Range Comment  Total CK 485 (*) 7 - 232 (U/L)    CK, MB 24.0 (*) 0.3 - 4.0 (ng/mL)    Troponin I <0.30  <0.30 (ng/mL)    Relative Index 4.9 (*) 0.0 - 2.5    SEDIMENTATION RATE     Status: Normal   Collection Time   12/10/10  6:00 AM      Component Value Range Comment   Sed Rate 1  0 - 16 (mm/hr)    Recent Results (from the past 240 hour(s))  MRSA PCR SCREENING     Status: Abnormal   Collection Time   12/09/10  9:07 PM      Component Value Range Status Comment   MRSA by PCR INVALID RESULTS,  SPECIMEN SENT FOR CULTURE (*) NEGATIVE  Final   MRSA CULTURE     Status: Normal (Preliminary result)   Collection Time   12/09/10  9:07 PM      Component Value Range Status Comment   Specimen Description NOSE   Final    Special Requests NONE   Final    Culture NO GROWTH   Final    Report Status PENDING   Incomplete      Hospital Course: This 25 year old man was admitted with left-sided sharp, squeezing chest pain. Please see initial history and physical examination done by Dr. Vania Williamson. Serial cardiac enzymes were negative for myocardial ischemia or infarction. Electrocardiogram changes only showed sinus bradycardia. There was no evidence of myocarditis or pericarditis on echocardiogram. He did have a CT of his chest which was suggesting the possibility of a pericardial effusion. He therefore underwent an echocardiogram which showed ejection fraction of 55-60% without evidence of any significant pericardial effusion. He was reviewed by Dr. Dietrich Pates, cardiology, who felt that he may warrant a stress test or pulmonary function tests. The pulmonary function tests are unable to be done today but can be done as an outpatient. Today he feels reasonably well and the chest pain is resolved. He denies any dyspnea, palpitations and there has been no syncopal episodes.  Discharge Exam: Blood pressure 110/62, pulse 53, temperature 97.6 F (36.4 C), temperature source Oral, resp. rate 20, height 5\' 9"  (1.753 m), weight 78.155 kg (172 lb 4.8 oz), SpO2 97.00%. He looks systemically well. Heart sounds are present and normal without murmurs. There is no gallop rhythm. There is no pericardial rub. Lung fields are clear without any pleura. Abdomen is soft and nontender. There is no hepatosplenomegaly. Neurologically, there are no focal neurological signs.  Disposition: Home. He will followup with Dr. Dietrich Pates in the office and have outpatient pulmonary function tests.  Discharge Orders    Future  Appointments: Provider: Department: Dept Phone: Center:   12/12/2010 3:00 PM [facility] Pul[facility]  None     Future Orders Please Complete By Expires   Diet - low sodium heart healthy      Increase activity slowly           Signed: Valeria Krisko C 12/11/2010, 9:53 AM

## 2010-12-12 ENCOUNTER — Ambulatory Visit (HOSPITAL_COMMUNITY)
Admit: 2010-12-12 | Discharge: 2010-12-12 | Disposition: A | Discharge status: Home / Self Care | Payer: Self-pay | Source: Ambulatory Visit | Attending: Adult Health | Admitting: Adult Health

## 2010-12-12 DIAGNOSIS — R0602 Shortness of breath: Secondary | ICD-10-CM | POA: Insufficient documentation

## 2010-12-12 LAB — PULMONARY FUNCTION TEST

## 2010-12-12 NOTE — Procedures (Signed)
NAMEDAMICO, PARTIN                 ACCOUNT NO.:  [id number]  MEDICAL RECORD NO.:  [id number]  LOCATION:  RESP                          FACILITY:  [facility]  PHYSICIAN:  Edward L. Juanetta Gosling, M.D.DATE OF BIRTH:  Dec 16, 1985  DATE OF PROCEDURE: DATE OF DISCHARGE:                           PULMONARY FUNCTION TEST   Reason for pulmonary function testing is shortness of breath. 1. Spirometry shows no ventilatory defect and no definite airflow     obstruction. 2. Lung volumes are normal. 3. DLCO was mildly reduced. 4. There is no definite cause of shortness of breath demonstrated on     pulmonary function testing.     Edward L. Juanetta Gosling, M.D.     EL[facility]/MEDQ  D:  12/12/2010  T:  12/12/2010  Job:  [id number]  cc:   Bettey Mare. Lyman Bishop, NP

## 2010-12-13 NOTE — Progress Notes (Signed)
Encounter addended by: Ree Shay, RN on: 12/13/2010  7:42 PM<BR>     Documentation filed: Charges VN

## 2010-12-18 ENCOUNTER — Emergency Department (HOSPITAL_COMMUNITY): Payer: Self-pay

## 2010-12-18 ENCOUNTER — Emergency Department (HOSPITAL_COMMUNITY)
Admission: EM | Admit: 2010-12-18 | Discharge: 2010-12-18 | Disposition: A | Discharge status: Home / Self Care | Payer: Self-pay | Attending: Emergency Medicine | Admitting: Emergency Medicine

## 2010-12-18 ENCOUNTER — Encounter (HOSPITAL_COMMUNITY): Payer: Self-pay | Admitting: *Deleted

## 2010-12-18 ENCOUNTER — Other Ambulatory Visit: Payer: Self-pay

## 2010-12-18 DIAGNOSIS — Z8614 Personal history of Methicillin resistant Staphylococcus aureus infection: Secondary | ICD-10-CM | POA: Insufficient documentation

## 2010-12-18 DIAGNOSIS — Z86718 Personal history of other venous thrombosis and embolism: Secondary | ICD-10-CM | POA: Insufficient documentation

## 2010-12-18 DIAGNOSIS — Z880 Allergy status to penicillin: Secondary | ICD-10-CM | POA: Insufficient documentation

## 2010-12-18 DIAGNOSIS — F17200 Nicotine dependence, unspecified, uncomplicated: Secondary | ICD-10-CM | POA: Insufficient documentation

## 2010-12-18 DIAGNOSIS — R0602 Shortness of breath: Secondary | ICD-10-CM | POA: Insufficient documentation

## 2010-12-18 DIAGNOSIS — R071 Chest pain on breathing: Secondary | ICD-10-CM | POA: Insufficient documentation

## 2010-12-18 DIAGNOSIS — R110 Nausea: Secondary | ICD-10-CM | POA: Insufficient documentation

## 2010-12-18 DIAGNOSIS — R070 Pain in throat: Secondary | ICD-10-CM | POA: Insufficient documentation

## 2010-12-18 DIAGNOSIS — E079 Disorder of thyroid, unspecified: Secondary | ICD-10-CM | POA: Insufficient documentation

## 2010-12-18 LAB — BASIC METABOLIC PANEL
BUN: 15 mg/dL (ref 6–23)
CO2: 25 mEq/L (ref 19–32)
Calcium: 9.9 mg/dL (ref 8.4–10.5)
Chloride: 101 mEq/L (ref 96–112)
Creatinine, Ser: 1.11 mg/dL (ref 0.50–1.35)

## 2010-12-18 LAB — CBC
HCT: 37.7 % — ABNORMAL LOW (ref 39.0–52.0)
MCH: 28 pg (ref 26.0–34.0)
MCV: 83 fL (ref 78.0–100.0)
Platelets: 175 10*3/uL (ref 150–400)
RBC: 4.54 MIL/uL (ref 4.22–5.81)
WBC: 7.8 10*3/uL (ref 4.0–10.5)

## 2010-12-18 MED ORDER — MORPHINE SULFATE 4 MG/ML IJ SOLN
4.0000 mg | Freq: Once | INTRAMUSCULAR | Status: AC
  Administered 2010-12-18: 4 mg via INTRAVENOUS
  Filled 2010-12-18: qty 1

## 2010-12-18 MED ORDER — KETOROLAC TROMETHAMINE 30 MG/ML IJ SOLN
30.0000 mg | Freq: Once | INTRAMUSCULAR | Status: AC
  Administered 2010-12-18: 30 mg via INTRAVENOUS
  Filled 2010-12-18: qty 1

## 2010-12-18 MED ORDER — IBUPROFEN 600 MG PO TABS: 600.0000 mg | ORAL_TABLET | Freq: Four times a day (QID) | ORAL | Status: AC | PRN

## 2010-12-18 MED ORDER — SODIUM CHLORIDE 0.9 % IV BOLUS (SEPSIS): 1000.0000 mL | Freq: Once | INTRAVENOUS | Status: DC

## 2010-12-18 NOTE — ED Notes (Signed)
States he was chased by dogs prior to arrival. Recently mother threw him of of her home

## 2010-12-18 NOTE — ED Notes (Signed)
C/o chest pain, sore throat , back pain

## 2010-12-18 NOTE — ED Provider Notes (Addendum)
History  Scribed for Dr. Patria Mane, the patient was seen in H1 The chart was scribed by Gilman Schmidt. The patients care was started at 2100.  CSN: [id number] Arrival date & time: 12/18/2010  7:52 PM  Chief Complaint  Patient presents with  . Chest Pain   HPI Nathan Williamson is a 25 y.o. male who presents to the Emergency Department complaining of a sore throat. Patient reports that he was walking to the ED for symptoms of sore throat and nausea when he developed chest pain and SOB. Patient denies any vomiting or cough. There are no other associated symptoms and no other alleviating or aggravating factors. No cough or congestion. No fevers. No productive cough. Pain with swallowing. No difficulty breathing or swallowing. No hx of ACS. Pt reports he is homeless and has no where to stay which is why he packed his bags tonight and brought them to the ER  HPI ELEMENTS:  Duration: consistent since onset Timing: constant  Context: as above  Associated symptoms: chest pain, back pain, nausea, but denies any vomiting or cough    PAST MEDICAL HISTORY:  Past Medical History  Diagnosis Date  . Embolism - blood clot   . Thyroid disease   . ITP (idiopathic thrombocytopenic purpura)   . MRSA infection (methicillin-resistant Staphylococcus aureus) 2009     PAST SURGICAL HISTORY:  History reviewed. No pertinent past surgical history.   MEDICATIONS:  Previous Medications   LEVOTHYROXINE (SYNTHROID, LEVOTHROID) 175 MCG TABLET    Take 175 mcg by mouth daily.    OXYCODONE (ROXICODONE) 5 MG IMMEDIATE RELEASE TABLET    Take 1 tablet (5 mg total) by mouth every 4 (four) hours as needed for pain.     ALLERGIES:  Allergies as of 12/18/2010 - Review Complete 12/09/2010  Allergen Reaction Noted  . Penicillins Anaphylaxis 10/29/2010     FAMILY HISTORY:  No family history on file.   SOCIAL HISTORY: History  Substance Use Topics  . Smoking status: Current Everyday Smoker -- 0.2 packs/day    Types:  Cigarettes  . Smokeless tobacco: Not on file  . Alcohol Use: Yes       Review of Systems  HENT: Positive for sore throat.   Respiratory: Positive for shortness of breath. Negative for cough.   Cardiovascular: Positive for chest pain.  Gastrointestinal: Positive for nausea. Negative for vomiting.  All other systems reviewed and are negative.    Physical Exam  BP 117/75  Pulse 85  Temp 98.7 F (37.1 C)  Resp 22  SpO2 100%  Physical Exam  Constitutional: He is oriented to person, place, and time. He appears well-developed and well-nourished.  HENT:  Head: Normocephalic and atraumatic.  Mouth/Throat: No posterior oropharyngeal edema or posterior oropharyngeal erythema.       Thyroid megaly   Eyes: Conjunctivae and EOM are normal. Pupils are equal, round, and reactive to light.  Neck: Normal range of motion and phonation normal. Neck supple.  Cardiovascular: Normal rate, regular rhythm, normal heart sounds and intact distal pulses.   Pulmonary/Chest: Effort normal and breath sounds normal. He exhibits no bony tenderness.  Abdominal: Soft. Normal appearance. There is no tenderness.  Musculoskeletal: Normal range of motion.  Neurological: He is alert and oriented to person, place, and time. He has normal strength. No cranial nerve deficit or sensory deficit. He exhibits normal muscle tone. Coordination normal.  Skin: Skin is warm, dry and intact.  Psychiatric: He has a normal mood and affect. His behavior is normal.  Judgment and thought content normal.   OTHER DATA REVIEWED: Nursing notes, vital signs, and past medical records reviewed.  Date: 12/18/2010  Rate: 61  Rhythm: normal sinus rhythm  QRS Axis: normal  Intervals: normal  ST/T Wave abnormalities: normal  Conduction Disutrbances:none  Narrative Interpretation:   Old EKG Reviewed: unchanged   DIAGNOSTIC STUDIES: Oxygen Saturation is 100% on room air, normal by my interpretation.    LABS:  Results for orders  placed during the hospital encounter of 12/18/10  CBC      Component Value Range   WBC 7.8  4.0 - 10.5 (K/uL)   RBC 4.54  4.22 - 5.81 (MIL/uL)   Hemoglobin 12.7 (*) 13.0 - 17.0 (g/dL)   HCT 46.9 (*) 62.9 - 52.0 (%)   MCV 83.0  78.0 - 100.0 (fL)   MCH 28.0  26.0 - 34.0 (pg)   MCHC 33.7  30.0 - 36.0 (g/dL)   RDW 52.8  41.3 - 24.4 (%)   Platelets 175  150 - 400 (K/uL)  BASIC METABOLIC PANEL      Component Value Range   Sodium 136  135 - 145 (mEq/L)   Potassium 4.3  3.5 - 5.1 (mEq/L)   Chloride 101  96 - 112 (mEq/L)   CO2 25  19 - 32 (mEq/L)   Glucose, Bld 67 (*) 70 - 99 (mg/dL)   BUN 15  6 - 23 (mg/dL)   Creatinine, Ser 0.10  0.50 - 1.35 (mg/dL)   Calcium 9.9  8.4 - 27.2 (mg/dL)   GFR calc non Af Amer >60  >60 (mL/min)   GFR calc Af Amer >60  >60 (mL/min)    RADIOLOGY:  DG Chest 2 View; Reviewed by me. IMPRESSION: No active cardiopulmonary disease. Original Report Authenticated By: Cyndie Chime, M.D  Results for orders placed during the hospital encounter of 12/18/10  CBC      Component Value Range   WBC 7.8  4.0 - 10.5 (K/uL)   RBC 4.54  4.22 - 5.81 (MIL/uL)   Hemoglobin 12.7 (*) 13.0 - 17.0 (g/dL)   HCT 53.6 (*) 64.4 - 52.0 (%)   MCV 83.0  78.0 - 100.0 (fL)   MCH 28.0  26.0 - 34.0 (pg)   MCHC 33.7  30.0 - 36.0 (g/dL)   RDW 03.4  74.2 - 59.5 (%)   Platelets 175  150 - 400 (K/uL)  BASIC METABOLIC PANEL      Component Value Range   Sodium 136  135 - 145 (mEq/L)   Potassium 4.3  3.5 - 5.1 (mEq/L)   Chloride 101  96 - 112 (mEq/L)   CO2 25  19 - 32 (mEq/L)   Glucose, Bld 67 (*) 70 - 99 (mg/dL)   BUN 15  6 - 23 (mg/dL)   Creatinine, Ser 6.38  0.50 - 1.35 (mg/dL)   Calcium 9.9  8.4 - 75.6 (mg/dL)   GFR calc non Af Amer >60  >60 (mL/min)   GFR calc Af Amer >60  >60 (mL/min)     MDM:   Chest wall pain. Doubt ACS. Doubt PE. Pain improved. May represent costochondritis vs pericarditiis. Dc home with NSAIDs and pcp followup  IMPRESSION: Diagnoses that have been  ruled out:  Diagnoses that are still under consideration:  Final diagnoses:    PLAN:  Home The patient is to return the emergency department if there is any worsening of symptoms. I have reviewed the discharge instructions with the patient.   CONDITION ON DISCHARGE: Stable  MEDICATIONS GIVEN IN THE E.D.  Medications  sodium chloride 0.9 % bolus 1,000 mL (not administered)  morphine injection 4 mg (4 mg Intravenous Given 12/18/10 2342)  ketorolac (TORADOL) injection 30 mg (30 mg Intravenous Given 12/18/10 2342)    DISCHARGE MEDICATIONS: New Prescriptions   No medications on file    SCRIBE ATTESTATION:  I personally performed the services described in this documentation, which was scribed in my presence. The recorded information has been reviewed and considered. Lyanne Co, MD    Procedures        Lyanne Co, MD 12/18/10 2345  Lyanne Co, MD 12/18/10 [phone]

## 2010-12-20 ENCOUNTER — Encounter (HOSPITAL_COMMUNITY): Payer: Self-pay | Admitting: Adult Health

## 2010-12-25 NOTE — Progress Notes (Signed)
Encounter addended by: Karen Kays on: 12/25/2010 11:38 AM<BR>     Documentation filed: Flowsheet VN

## 2011-01-04 ENCOUNTER — Encounter (HOSPITAL_COMMUNITY): Payer: Self-pay | Admitting: *Deleted

## 2011-01-04 ENCOUNTER — Emergency Department (HOSPITAL_COMMUNITY)
Admission: EM | Admit: 2011-01-04 | Discharge: 2011-01-04 | Disposition: A | Discharge status: Home / Self Care | Payer: Self-pay | Attending: Emergency Medicine | Admitting: Emergency Medicine

## 2011-01-04 DIAGNOSIS — R0789 Other chest pain: Secondary | ICD-10-CM

## 2011-01-04 DIAGNOSIS — Z87891 Personal history of nicotine dependence: Secondary | ICD-10-CM | POA: Insufficient documentation

## 2011-01-04 DIAGNOSIS — Z86718 Personal history of other venous thrombosis and embolism: Secondary | ICD-10-CM | POA: Insufficient documentation

## 2011-01-04 DIAGNOSIS — Z8614 Personal history of Methicillin resistant Staphylococcus aureus infection: Secondary | ICD-10-CM | POA: Insufficient documentation

## 2011-01-04 DIAGNOSIS — R071 Chest pain on breathing: Secondary | ICD-10-CM | POA: Insufficient documentation

## 2011-01-04 DIAGNOSIS — E079 Disorder of thyroid, unspecified: Secondary | ICD-10-CM | POA: Insufficient documentation

## 2011-01-04 MED ORDER — METHOCARBAMOL 500 MG PO TABS: ORAL_TABLET | ORAL | Status: DC

## 2011-01-04 NOTE — ED Notes (Signed)
Pt states that he needs a prescription for oxycodone. States that he was in the hospital the beginning of September for chest pain. States that he is getting ready to go out of town to [employer], [employer] systems and needs some more pain meds.

## 2011-01-04 NOTE — ED Provider Notes (Signed)
History     CSN: [id number] Arrival date & time: 01/04/2011  3:52 PM  Chief Complaint  Patient presents with  . Medication Refill    HPI  (Consider location/radiation/quality/duration/timing/severity/associated sxs/prior treatment)  HPI Comments: Pt states that in early Sept 2012 he developed a pain in the anterior chest associated with difficulty breathing. [facility], but no definitive diagnosis was found. He had a breathing test and was scheduled for a stress test, but this did not take place. His pain is better but still present at times. He is going out of town and request pain medication while away. He does not have a PCP.  No cough. No hemoptosis. There is question of fever but he is unsure of the measurement. No recent injury since being out of the hospital. He has not taken anything since the oxycodone ran out (2 days ago).  The history is provided by the patient.    Past Medical History  Diagnosis Date  . Embolism - blood clot   . Thyroid disease   . ITP (idiopathic thrombocytopenic purpura)   . MRSA infection (methicillin-resistant Staphylococcus aureus) 2009    History reviewed. No pertinent past surgical history.  History reviewed. No pertinent family history.  History  Substance Use Topics  . Smoking status: Former Smoker -- 0.2 packs/day    Types: Cigarettes  . Smokeless tobacco: Not on file  . Alcohol Use: Yes      Review of Systems  Review of Systems  Constitutional: Negative for activity change.       All ROS Neg except as noted in HPI  HENT: Negative for nosebleeds and neck pain.   Eyes: Negative for photophobia and discharge.  Respiratory: Negative for cough, shortness of breath and wheezing.   Cardiovascular: Negative for chest pain and palpitations.  Gastrointestinal: Negative for abdominal pain and blood in stool.  Genitourinary: Negative for dysuria, frequency and hematuria.  Musculoskeletal: Negative for back pain and  arthralgias.  Skin: Negative.   Neurological: Negative for dizziness, seizures and speech difficulty.  Psychiatric/Behavioral: Negative for hallucinations and confusion.    Allergies  Penicillins  Home Medications   Current Outpatient Rx  Name Route Sig Dispense Refill  . LEVOTHYROXINE SODIUM 175 MCG PO TABS Oral Take 175 mcg by mouth daily.       Physical Exam    BP 115/77  Pulse 75  Temp(Src) 98.6 F (37 C) (Oral)  Resp 16  Ht 5\' 9"  (1.753 m)  Wt 180 lb (81.647 kg)  BMI 26.58 kg/m2  SpO2 100%  Physical Exam  Nursing note and vitals reviewed. Constitutional: He is oriented to person, place, and time. He appears well-developed and well-nourished.  Non-toxic appearance.  HENT:  Head: Normocephalic.  Right Ear: Tympanic membrane and external ear normal.  Left Ear: Tympanic membrane and external ear normal.  Eyes: EOM and lids are normal. Pupils are equal, round, and reactive to light.  Neck: Normal range of motion. Neck supple. Carotid bruit is not present.  Cardiovascular: Normal rate, regular rhythm, normal heart sounds, intact distal pulses and normal pulses.   Pulmonary/Chest: Breath sounds normal. No respiratory distress. He exhibits no tenderness.  Abdominal: Soft. Bowel sounds are normal. There is no tenderness. There is no guarding.       Spline non tender or enlarged.  Musculoskeletal: Normal range of motion.  Lymphadenopathy:       Head (right side): No submandibular adenopathy present.       Head (  left side): No submandibular adenopathy present.    He has no cervical adenopathy.  Neurological: He is alert and oriented to person, place, and time. He has normal strength. No cranial nerve deficit or sensory deficit.  Skin: Skin is warm and dry.  Psychiatric: He has a normal mood and affect. His speech is normal.    ED Course  Procedures (including critical care time)  Labs Reviewed - No data to display Dg Chest 2 View  12/18/2010  *RADIOLOGY REPORT*   Clinical Data: Chest pain.  CHEST - 2 VIEW  Comparison: 12/09/2010  Findings: Heart and mediastinal contours are within normal limits. No focal opacities or effusions.  No acute bony abnormality.  IMPRESSION: No active cardiopulmonary disease.  Original Report Authenticated By: Cyndie Chime, M.D.   Dg Chest 2 View  12/09/2010  *RADIOLOGY REPORT*  Clinical Data: Chest pain and shortness of breath.  CHEST - 2 VIEW  Comparison: PA and lateral chest 10/29/2010.  Findings: The lungs are clear.  Heart size is normal.  No pneumothorax or pleural effusion.  IMPRESSION: Normal chest.  Original Report Authenticated By: Bernadene Bell. Maricela Curet, M.D.   Ct Angio Chest W/cm &/or Wo Cm  12/09/2010  *RADIOLOGY REPORT*  Clinical Data:  Chest pain.  CT ANGIOGRAPHY CHEST WITH CONTRAST  Technique:  Multidetector CT imaging of the chest was performed using the standard protocol during bolus administration of intravenous contrast.  Multiplanar CT image reconstructions including MIPs were obtained to evaluate the vascular anatomy.  Contrast:  100 ml Omnipaque-300.  Comparison:  CT chest 12/31/2009 and 04/15/03.  Findings:  No pulmonary embolus is identified.  As seen on the most recent CT scan, the thyroid gland is massively enlarged.  No pleural effusion is identified.  There is cardiomegaly. Small amount of pericardial fluid is noted.  Contrast material refluxes into the inferior vena cava. No lymphadenopathy.  Lungs are clear. Incidentally imaged upper abdomen demonstrates low attenuation of the liver.  No focal bony abnormality.  Review of the MIP images confirms the above findings.  IMPRESSION:  1.  Negative for pulmonary embolus. 2.  Marked enlargement of the thyroid gland diffusely compatible with goiter. 3.  Heart size appears mildly enlarged with some reflux of contrast material into the inferior vena cava suggesting right heart insufficiency. 4.  Low attenuation of the liver compatible with fatty change.  Original Report  Authenticated By: Bernadene Bell. Maricela Curet, M.D.     Dx: Chest Wall Pain.  MDM I have reviewed nursing notes, vital signs, and all appropriate lab and imaging results for this patient.  I have reviewed the previous ED visits, and previous CT scans and xrays. No acute reason for pain found. Discussed need for PCP or follow up with the [facility] MD for assistance with ongoing chest wall pain.      Kathie Dike, [place] 01/04/11 1708

## 2011-01-04 NOTE — ED Notes (Signed)
Pt states he is out of his pain medication and would like it if the doctor would prescribe more.

## 2011-01-07 LAB — CULTURE, ROUTINE-ABSCESS: Gram Stain: NONE SEEN

## 2011-01-24 LAB — CBC
HCT: 38 — ABNORMAL LOW
Hemoglobin: 12.5 — ABNORMAL LOW
MCHC: 33
MCV: 81.6
Platelets: 184
RDW: 12.9

## 2011-01-24 LAB — DIFFERENTIAL
Basophils Absolute: 0
Basophils Relative: 1
Lymphocytes Relative: 28
Monocytes Relative: 7
Neutro Abs: 5.2
Neutrophils Relative %: 62

## 2011-01-24 LAB — URINALYSIS, ROUTINE W REFLEX MICROSCOPIC
Bilirubin Urine: NEGATIVE
Glucose, UA: NEGATIVE
Hgb urine dipstick: NEGATIVE
Ketones, ur: NEGATIVE
Protein, ur: NEGATIVE
pH: 7.5

## 2011-01-24 LAB — COMPREHENSIVE METABOLIC PANEL
Alkaline Phosphatase: 50
BUN: 9
Creatinine, Ser: 1.18
Glucose, Bld: 88
Potassium: 3.8
Total Protein: 7

## 2011-02-01 NOTE — ED Provider Notes (Signed)
Evaluation and management procedures were performed by the PA/NP under my supervision/collaboration.    Felisa Bonier, MD 02/01/11 [phone]

## 2011-10-02 IMAGING — CR DG CHEST 2V
2 series · 2 of 2 positions shown · non-contrast
Comparison: PA and lateral chest 10/29/2010.

CLINICAL DATA: Chest pain and shortness of breath.

CHEST - 2 VIEW

[view not recorded (1 of 2)]
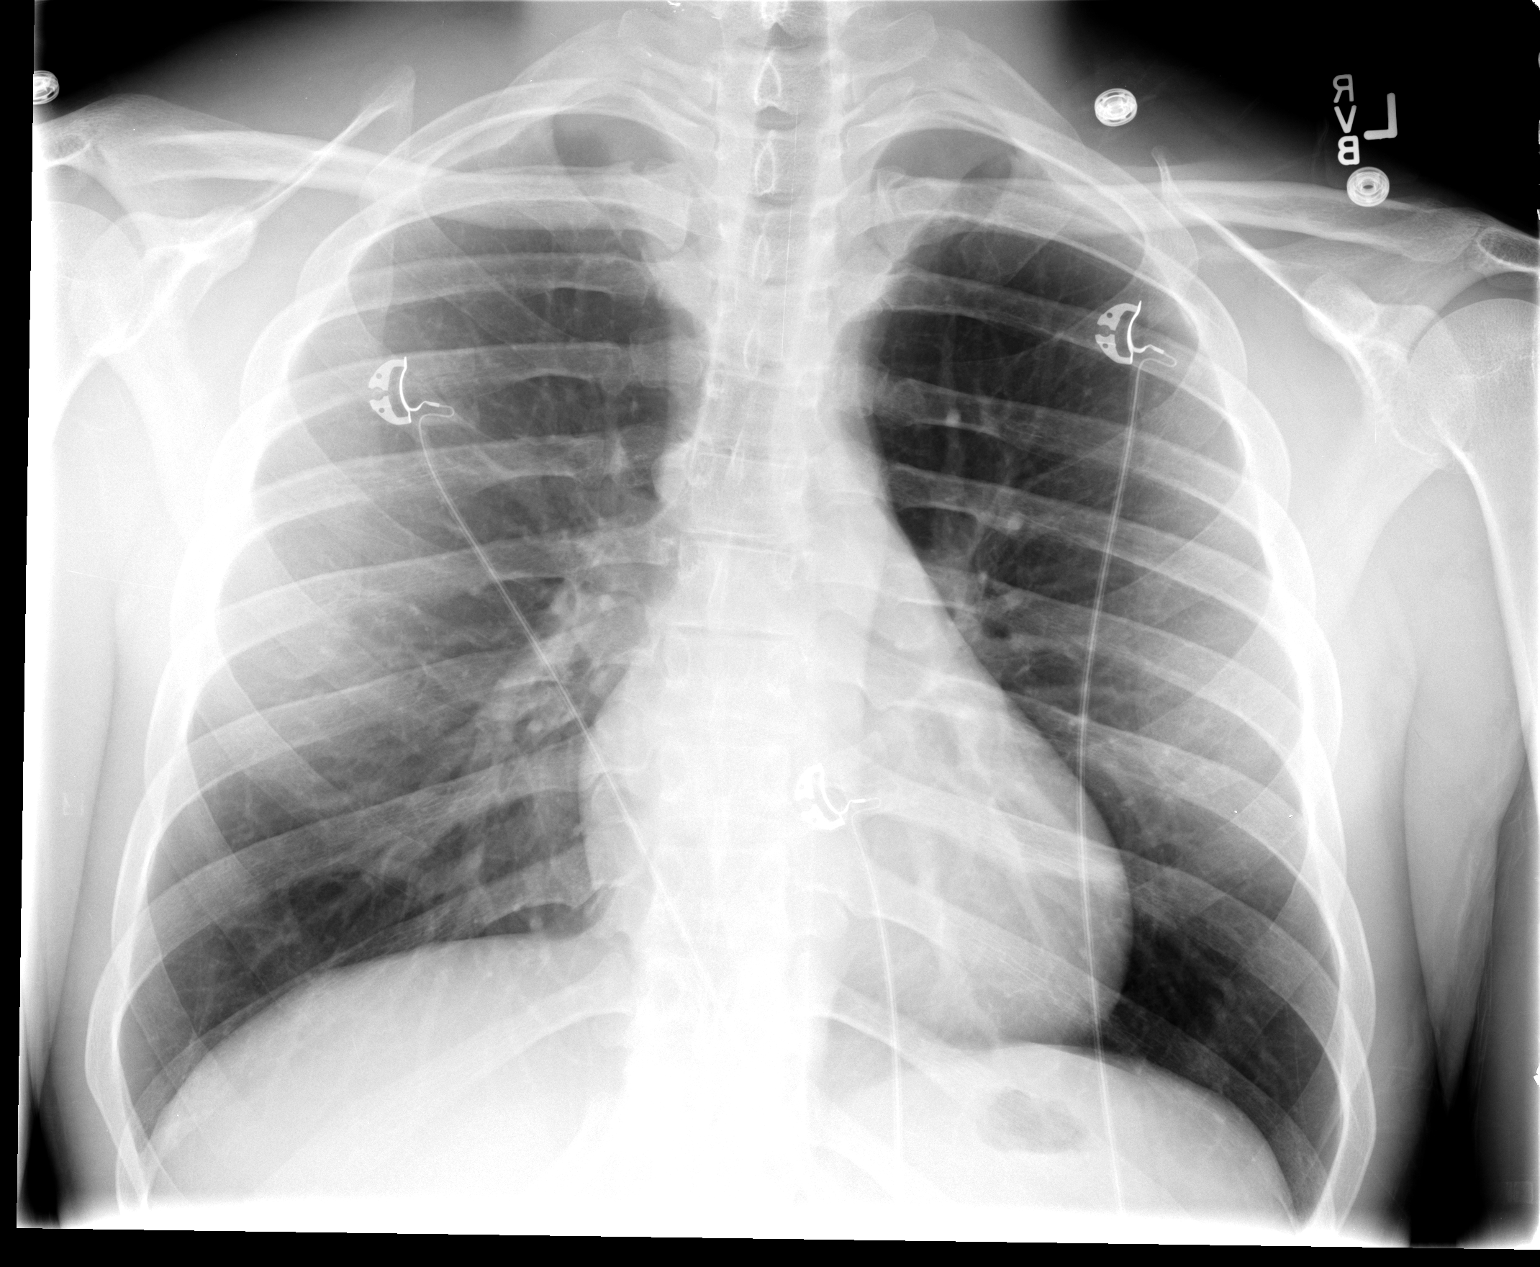

[view not recorded (2 of 2)]
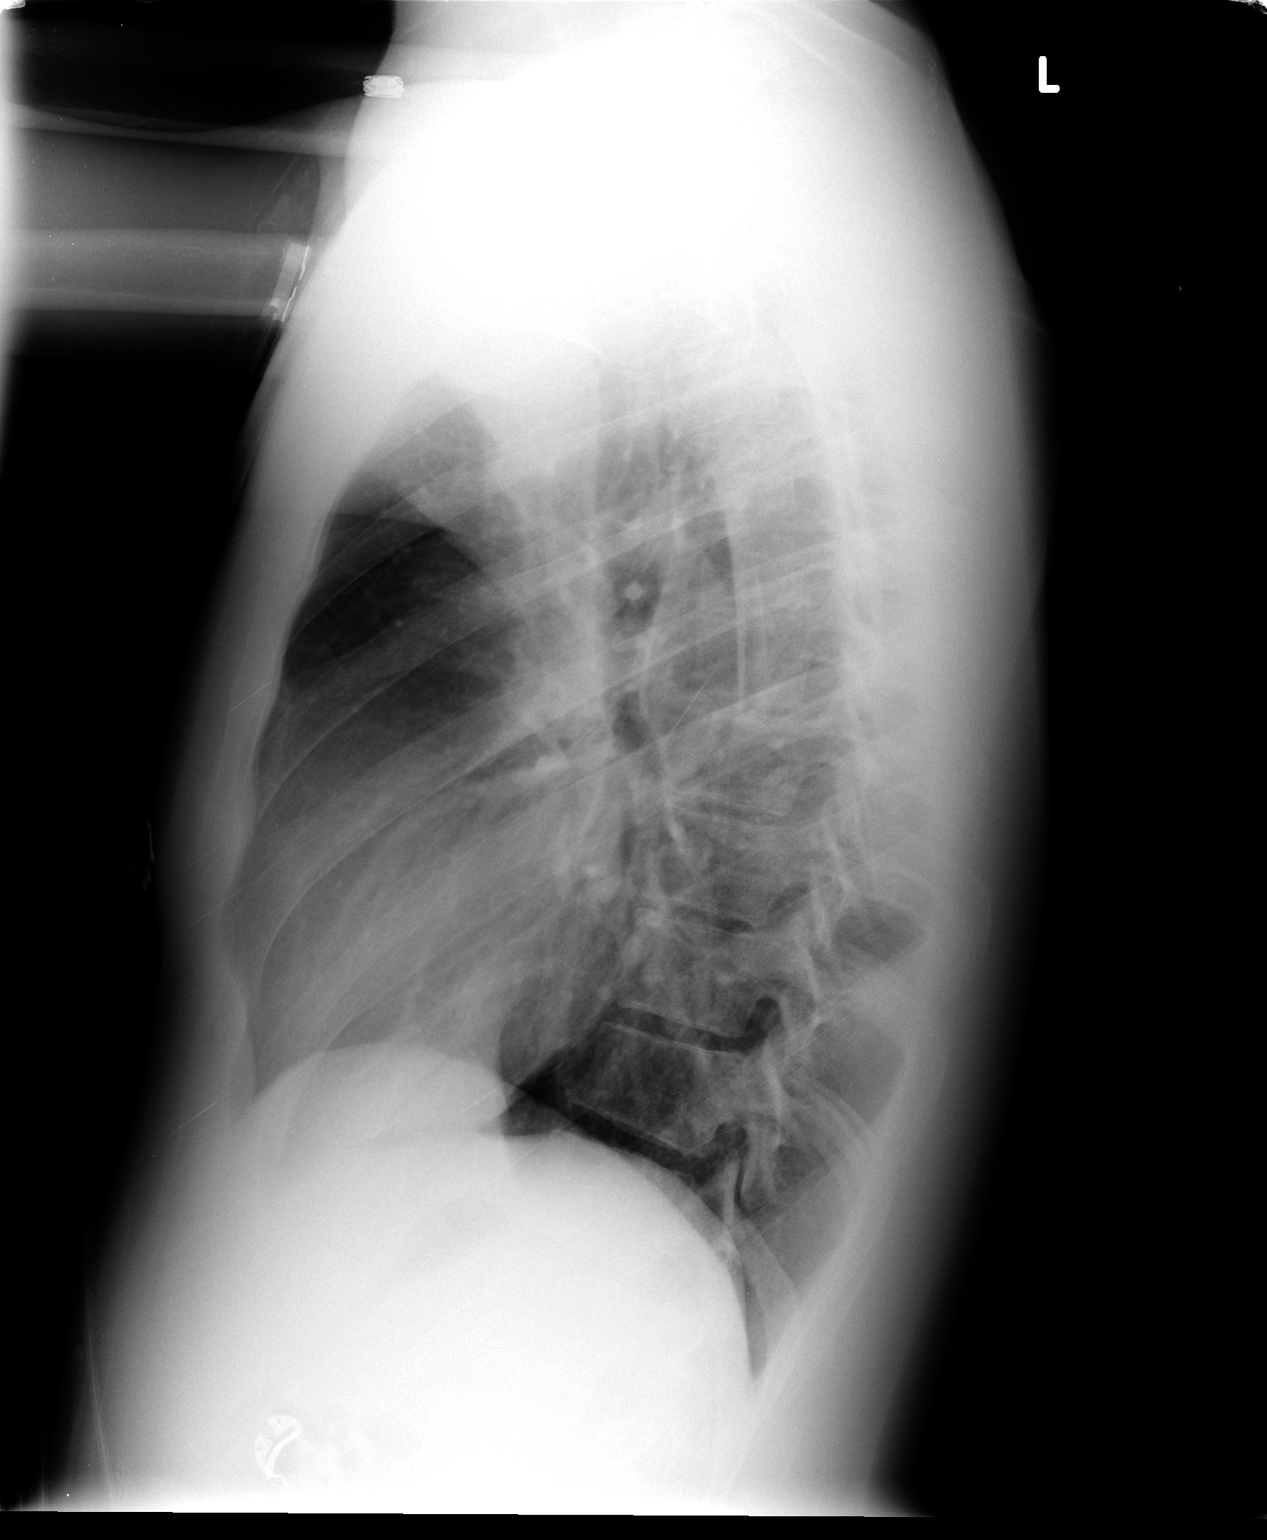

[2 of 2 positions shown; findings below may reference images not displayed]

FINDINGS: The lungs are clear.  Heart size is normal.  No
pneumothorax or pleural effusion.
IMPRESSION: Normal chest.

## 2011-10-02 IMAGING — CT CT ANGIO CHEST
2 of 6 series · 6 of 36 positions shown · IV contrast (Omnipaque 300)
Comparison: CT chest 12/31/2009 and 04/15/03.

CLINICAL DATA: Chest pain.

CT ANGIOGRAPHY CHEST WITH CONTRAST
TECHNIQUE: Multidetector CT imaging of the chest was performed
using the standard protocol during bolus administration of
intravenous contrast.  Multiplanar CT image reconstructions
including MIPs were obtained to evaluate the vascular anatomy.
Contrast:  100 ml 0mnipaque-399.

[Series 4: pe 3.0 b40f · axial · 0.66mm/px · z∈[-209,-44]mm · 5 of 83 slices shown]
[im 14/83  lung]
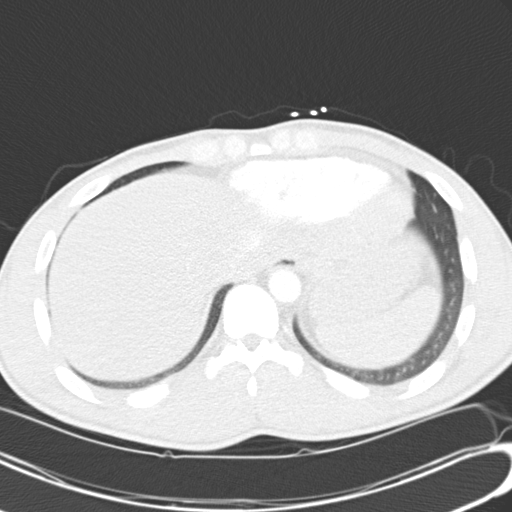
[im 28/83  mediastinal]
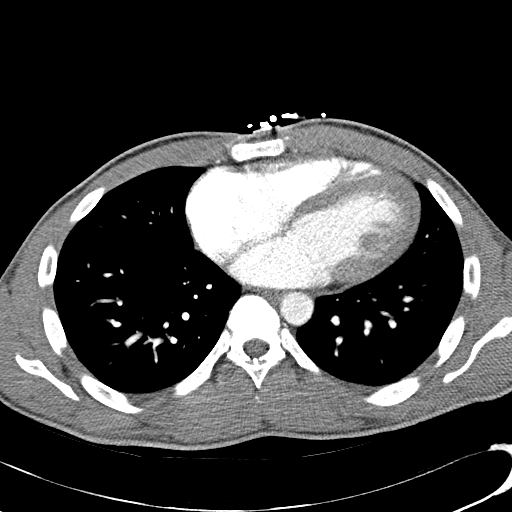
[im 42/83  lung]
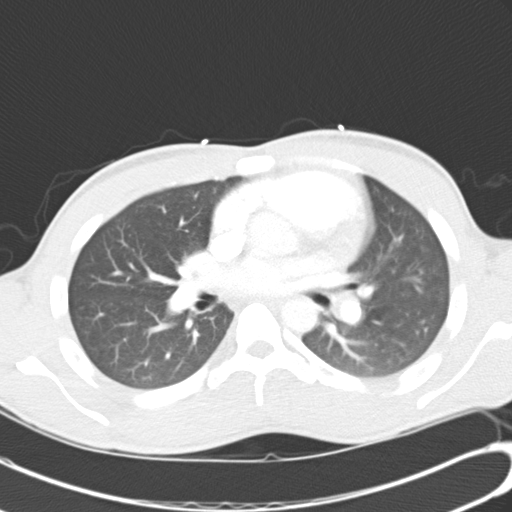
[im 55/83  mediastinal]
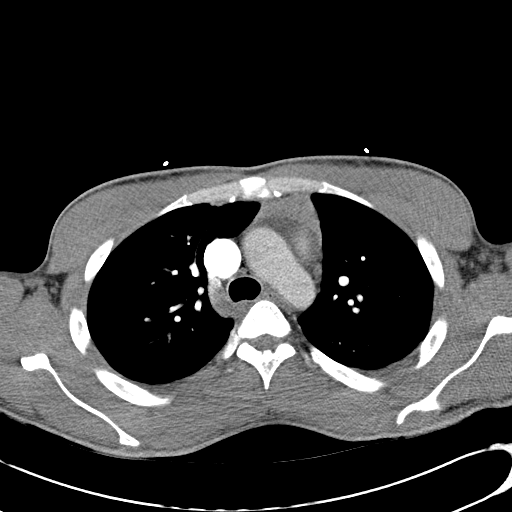
[im 69/83  lung]
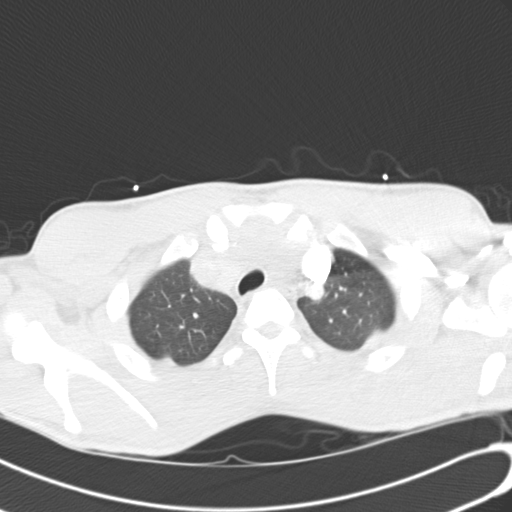

[Series 6: mpr coronal pe 3mm · coronal · 0.50mm/px · 1 of 66 slices shown]
[im 33/66  mediastinal]
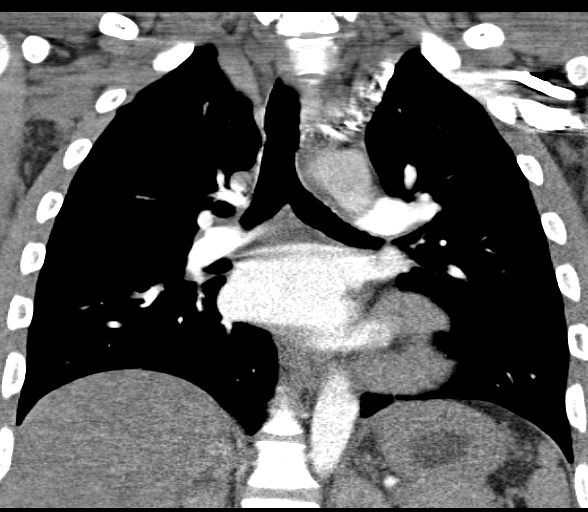

[6 of 36 positions shown; findings below may reference images not displayed]

FINDINGS: No pulmonary embolus is identified.  As seen on the most
recent CT scan, the thyroid gland is massively enlarged.  No
pleural effusion is identified.  There is cardiomegaly. Small
amount of pericardial fluid is noted.  Contrast material refluxes
into the inferior vena cava. No lymphadenopathy.  Lungs are clear.
Incidentally imaged upper abdomen demonstrates low attenuation of
the liver.  No focal bony abnormality.

Review of the MIP images confirms the above findings.
IMPRESSION: 1.  Negative for pulmonary embolus.
2.  Marked enlargement of the thyroid gland diffusely compatible
with goiter.
3.  Heart size appears mildly enlarged with some reflux of contrast
material into the inferior vena cava suggesting right heart
insufficiency.
4.  Low attenuation of the liver compatible with fatty change.

## 2013-08-24 ENCOUNTER — Encounter (HOSPITAL_COMMUNITY): Payer: Self-pay | Admitting: Emergency Medicine

## 2013-08-24 ENCOUNTER — Emergency Department (HOSPITAL_COMMUNITY)
Admission: EM | Admit: 2013-08-24 | Discharge: 2013-08-25 | Disposition: A | Discharge status: Home / Self Care | Payer: PRIVATE HEALTH INSURANCE | Attending: Emergency Medicine | Admitting: Emergency Medicine

## 2013-08-24 ENCOUNTER — Emergency Department (HOSPITAL_COMMUNITY): Payer: PRIVATE HEALTH INSURANCE

## 2013-08-24 DIAGNOSIS — W92XXXA Exposure to excessive heat of man-made origin, initial encounter: Secondary | ICD-10-CM | POA: Insufficient documentation

## 2013-08-24 DIAGNOSIS — Y9389 Activity, other specified: Secondary | ICD-10-CM | POA: Insufficient documentation

## 2013-08-24 DIAGNOSIS — H538 Other visual disturbances: Secondary | ICD-10-CM | POA: Insufficient documentation

## 2013-08-24 DIAGNOSIS — E860 Dehydration: Secondary | ICD-10-CM

## 2013-08-24 DIAGNOSIS — T675XXA Heat exhaustion, unspecified, initial encounter: Secondary | ICD-10-CM

## 2013-08-24 DIAGNOSIS — R109 Unspecified abdominal pain: Secondary | ICD-10-CM | POA: Insufficient documentation

## 2013-08-24 DIAGNOSIS — Y9289 Other specified places as the place of occurrence of the external cause: Secondary | ICD-10-CM | POA: Insufficient documentation

## 2013-08-24 DIAGNOSIS — Y990 Civilian activity done for income or pay: Secondary | ICD-10-CM | POA: Insufficient documentation

## 2013-08-24 DIAGNOSIS — Z87891 Personal history of nicotine dependence: Secondary | ICD-10-CM | POA: Insufficient documentation

## 2013-08-24 DIAGNOSIS — IMO0001 Reserved for inherently not codable concepts without codable children: Secondary | ICD-10-CM | POA: Insufficient documentation

## 2013-08-24 DIAGNOSIS — R112 Nausea with vomiting, unspecified: Secondary | ICD-10-CM | POA: Insufficient documentation

## 2013-08-24 DIAGNOSIS — Z8614 Personal history of Methicillin resistant Staphylococcus aureus infection: Secondary | ICD-10-CM | POA: Insufficient documentation

## 2013-08-24 DIAGNOSIS — R079 Chest pain, unspecified: Secondary | ICD-10-CM | POA: Insufficient documentation

## 2013-08-24 DIAGNOSIS — Z86711 Personal history of pulmonary embolism: Secondary | ICD-10-CM | POA: Insufficient documentation

## 2013-08-24 DIAGNOSIS — R062 Wheezing: Secondary | ICD-10-CM | POA: Insufficient documentation

## 2013-08-24 DIAGNOSIS — Z79899 Other long term (current) drug therapy: Secondary | ICD-10-CM | POA: Insufficient documentation

## 2013-08-24 DIAGNOSIS — T673XXA Heat exhaustion, anhydrotic, initial encounter: Secondary | ICD-10-CM | POA: Insufficient documentation

## 2013-08-24 DIAGNOSIS — R42 Dizziness and giddiness: Secondary | ICD-10-CM | POA: Insufficient documentation

## 2013-08-24 DIAGNOSIS — Z880 Allergy status to penicillin: Secondary | ICD-10-CM | POA: Insufficient documentation

## 2013-08-24 DIAGNOSIS — R0602 Shortness of breath: Secondary | ICD-10-CM | POA: Insufficient documentation

## 2013-08-24 DIAGNOSIS — Z862 Personal history of diseases of the blood and blood-forming organs and certain disorders involving the immune mechanism: Secondary | ICD-10-CM | POA: Insufficient documentation

## 2013-08-24 DIAGNOSIS — E079 Disorder of thyroid, unspecified: Secondary | ICD-10-CM | POA: Insufficient documentation

## 2013-08-24 LAB — RAPID URINE DRUG SCREEN, HOSP PERFORMED
Amphetamines: NOT DETECTED
BARBITURATES: NOT DETECTED
Benzodiazepines: NOT DETECTED
Cocaine: NOT DETECTED
Opiates: NOT DETECTED
Tetrahydrocannabinol: POSITIVE — AB

## 2013-08-24 LAB — BASIC METABOLIC PANEL
BUN: 17 mg/dL (ref 6–23)
CALCIUM: 9 mg/dL (ref 8.4–10.5)
CO2: 24 mEq/L (ref 19–32)
CREATININE: 1.22 mg/dL (ref 0.50–1.35)
Chloride: 102 mEq/L (ref 96–112)
GFR, EST NON AFRICAN AMERICAN: 79 mL/min — AB (ref >90)
Glucose, Bld: 84 mg/dL (ref 70–99)
Potassium: 3.9 mEq/L (ref 3.7–5.3)
Sodium: 139 mEq/L (ref 137–147)

## 2013-08-24 LAB — CBC WITH DIFFERENTIAL/PLATELET
BASOS ABS: 0 10*3/uL (ref 0.0–0.1)
BASOS PCT: 0 % (ref 0–1)
EOS ABS: 0.1 10*3/uL (ref 0.0–0.7)
EOS PCT: 1 % (ref 0–5)
HEMATOCRIT: 37.9 % — AB (ref 39.0–52.0)
Hemoglobin: 12.8 g/dL — ABNORMAL LOW (ref 13.0–17.0)
Lymphocytes Relative: 40 % (ref 12–46)
Lymphs Abs: 3.6 10*3/uL (ref 0.7–4.0)
MCH: 27.9 pg (ref 26.0–34.0)
MCHC: 33.8 g/dL (ref 30.0–36.0)
MCV: 82.6 fL (ref 78.0–100.0)
MONO ABS: 0.7 10*3/uL (ref 0.1–1.0)
MONOS PCT: 7 % (ref 3–12)
NEUTROS ABS: 4.7 10*3/uL (ref 1.7–7.7)
Neutrophils Relative %: 52 % (ref 43–77)
Platelets: 203 10*3/uL (ref 150–400)
RBC: 4.59 MIL/uL (ref 4.22–5.81)
RDW: 13 % (ref 11.5–15.5)
WBC: 9.2 10*3/uL (ref 4.0–10.5)

## 2013-08-24 LAB — TROPONIN I

## 2013-08-24 LAB — CK: CK TOTAL: 1386 U/L — AB (ref 7–232)

## 2013-08-24 MED ORDER — KETOROLAC TROMETHAMINE 30 MG/ML IJ SOLN
30.0000 mg | Freq: Once | INTRAMUSCULAR | Status: AC
  Administered 2013-08-24: 30 mg via INTRAVENOUS
  Filled 2013-08-24: qty 1

## 2013-08-24 MED ORDER — SODIUM CHLORIDE 0.9 % IV BOLUS (SEPSIS)
1000.0000 mL | Freq: Once | INTRAVENOUS | Status: AC
  Administered 2013-08-24: 1000 mL via INTRAVENOUS

## 2013-08-24 MED ORDER — SODIUM CHLORIDE 0.9 % IV SOLN: 1000.0000 mL | INTRAVENOUS | Status: DC

## 2013-08-24 MED ORDER — SODIUM CHLORIDE 0.9 % IV SOLN
1000.0000 mL | Freq: Once | INTRAVENOUS | Status: AC
  Administered 2013-08-24: 1000 mL via INTRAVENOUS

## 2013-08-24 NOTE — ED Notes (Signed)
Patient reports chest pain, shortness of breath, headache, and lightheadedness. Reports, "I got too hot at work and that's when all of this started."

## 2013-08-24 NOTE — ED Notes (Signed)
Patient denies any drug use tonight. Eyes red in triage.

## 2013-08-24 NOTE — ED Provider Notes (Signed)
CSN: [id number]     Arrival date & time 08/24/13  2052 History   First MD Initiated Contact with Patient 08/24/13 2207   This chart was scribed for Ward Givens, MD by Valera Castle, ED Scribe. This patient was seen in room [facility] and the patient's care was started at 10:42 PM.   Chief Complaint  Patient presents with  . Chest Pain  . Headache   (Consider location/radiation/quality/duration/timing/severity/associated sxs/prior Treatment) The history is provided by the patient. No language interpreter was used.   HPI Comments: Nathan Williamson is a 28 y.o. male with h/o Embolism, Thyroid disease, and ITP, who presents to the Emergency Department complaining of a constant, throbbing, headache and intermittent chest pain, onset around 4:20 PM today while at work. 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 He reports overheating, started sweating a lot. He reports h/o thyroid disease, stating he was trying to tell employers he felt light-headed, might pass out. He reports vomiting 4-5 times today while at work. He also reports associated blurry vision PTA. He also reports associated SOB, wheezing, and abdominal pain. He states he left work due to his symptoms and went home. He reports his bilateral LE felt like they were going to give out on him while walking up the stairs. He states he has been working this job for 1 year. He states he and other employees were given Gatorade last year to drink when it was hot but not this year. Marland Kitchen He denies LOC, diarrhea, and any other associated symptoms. He reports h/o smoking 0.25 PPD.   Patient reports he goes to work about 2 PM. After his 420 break he started feeling bad. He reports he been sweating heavily while at work because it is not air conditioned. He states he started feeling lightheaded and felt like he was going to pass out. He had a headache at the back of his head described as throbbing. He had  blurred vision. He had nausea and vomiting x5 times. He denies diarrhea. He states his chest felt tight and squeezing and would come and go. He felt short of breath and felt like he was wheezing. He had mild abdominal pain mainly in the left upper quadrant. He also describes cramping in his legs.  PCP - [facility]  Past Medical History  Diagnosis Date  . Embolism - blood clot   . Thyroid disease   . ITP (idiopathic thrombocytopenic purpura)   . MRSA infection (methicillin-resistant Staphylococcus aureus) 2009   History reviewed. No pertinent past surgical history. History reviewed. No pertinent family history. History  Substance Use Topics  . Smoking status: Former Smoker -- 0.25 packs/day    Types: Cigarettes  . Smokeless tobacco: Not on file  . Alcohol Use: Yes  employed  Review of Systems  Constitutional: Positive for diaphoresis.  Eyes: Positive for visual disturbance (blurry vision).  Respiratory: Positive for shortness of breath.   Cardiovascular: Positive for chest pain.  Gastrointestinal: Positive for nausea, vomiting and abdominal pain.  Musculoskeletal: Positive for myalgias.  Neurological: Positive for light-headedness and headaches.  Hematological: Does not bruise/bleed easily.   Allergies  Penicillins  Home Medications   Prior to Admission medications   Medication Sig Start Date End Date Taking? Authorizing Provider  levothyroxine (SYNTHROID, LEVOTHROID) 175 MCG tablet Take 175 mcg by mouth daily.     Historical Provider, MD  methocarbamol (ROBAXIN) 500 MG tablet 2  po tid for spasm/pain 01/04/11   Kathie Dike, PA-C   BP 122/67  Pulse 52  Temp(Src) 97.8 F (36.6 C) (Oral)  Resp 18  Ht 5\' 9"  (1.753 m)  Wt 180 lb (81.647 kg)  BMI 26.57 kg/m2  SpO2 100%  Vital signs normal except bradycardia   Physical Exam  Nursing note and vitals reviewed. Constitutional: He is oriented to person, place, and time. He appears well-developed  and well-nourished. No distress.  HENT:  Head: Normocephalic and atraumatic.  Right Ear: External ear normal.  Left Ear: External ear normal.  Nose: Nose normal.  Tongue appears dry.   Eyes: Conjunctivae and EOM are normal. Pupils are equal, round, and reactive to light.  Neck: Normal range of motion. Neck supple. No tracheal deviation present.  Cardiovascular: Normal rate, regular rhythm and normal heart sounds.  Exam reveals no gallop and no friction rub.   No murmur heard. Pulmonary/Chest: Effort normal and breath sounds normal. No respiratory distress. He has no wheezes. He has no rales.  Abdominal: Soft. Bowel sounds are normal. He exhibits no distension. There is tenderness. There is no rebound and no guarding.  Mild tenderness  Musculoskeletal: Normal range of motion. He exhibits no edema and no tenderness.  Neurological: He is alert and oriented to person, place, and time.  Skin: Skin is warm and dry.  Psychiatric: He has a normal mood and affect. His behavior is normal.   ED Course  Procedures (including critical care time)  Medications  0.9 %  sodium chloride infusion (0 mLs Intravenous Stopped 08/25/13 0042)    Followed by  0.9 %  sodium chloride infusion (0 mLs Intravenous Stopped 08/24/13 2346)    Followed by  0.9 %  sodium chloride infusion (not administered)  ketorolac (TORADOL) 30 MG/ML injection 30 mg (30 mg Intravenous Given 08/24/13 2258)  sodium chloride 0.9 % bolus 1,000 mL (0 mLs Intravenous Stopped 08/25/13 0101)     DIAGNOSTIC STUDIES: Oxygen Saturation is 100% on room air, normal by my interpretation.    COORDINATION OF CARE: 10:51 PM-Discussed treatment plan which includes IV fluids, CXR with pt at bedside and pt agreed to plan.   2345 Patient was rechecked after he was close to getting his second liter of IV fluids. He still had not had any urinary output. He was given a third liter of IV fluid.  At time of discharge patient states he been to the  bathroom to urinate 3 times. He reports he feels much improved. His tongue is now moist. He feels ready to be discharged. We discussed drinking lots of fluids especially when he sweating a lot at work, specifically sports drinks like Gatorade.   Results for orders placed during the hospital encounter of 08/24/13  CBC WITH DIFFERENTIAL      Result Value Ref Range   WBC 9.2  4.0 - 10.5 K/uL   RBC 4.59  4.22 - 5.81 MIL/uL   Hemoglobin 12.8 (*) 13.0 - 17.0 g/dL   HCT 16.1 (*) 09.6 - 04.5 %   MCV 82.6  78.0 - 100.0 fL   MCH 27.9  26.0 - 34.0 pg   MCHC 33.8  30.0 - 36.0 g/dL   RDW 40.9  81.1 - 91.4 %   Platelets 203  150 - 400 K/uL   Neutrophils Relative % 52  43 - 77 %   Neutro Abs 4.7  1.7 - 7.7 K/uL   Lymphocytes Relative 40  12 - 46 %   Lymphs  Abs 3.6  0.7 - 4.0 K/uL   Monocytes Relative 7  3 - 12 %   Monocytes Absolute 0.7  0.1 - 1.0 K/uL   Eosinophils Relative 1  0 - 5 %   Eosinophils Absolute 0.1  0.0 - 0.7 K/uL   Basophils Relative 0  0 - 1 %   Basophils Absolute 0.0  0.0 - 0.1 K/uL  BASIC METABOLIC PANEL      Result Value Ref Range   Sodium 139  137 - 147 mEq/L   Potassium 3.9  3.7 - 5.3 mEq/L   Chloride 102  96 - 112 mEq/L   CO2 24  19 - 32 mEq/L   Glucose, Bld 84  70 - 99 mg/dL   BUN 17  6 - 23 mg/dL   Creatinine, Ser 4.091.22  0.50 - 1.35 mg/dL   Calcium 9.0  8.4 - 81.110.5 mg/dL   GFR calc non Af Amer 79 (*) >90 mL/min   GFR calc Af Amer >90  >90 mL/min  TROPONIN I      Result Value Ref Range   Troponin I <0.30  <0.30 ng/mL  URINE RAPID DRUG SCREEN (HOSP PERFORMED)      Result Value Ref Range   Opiates NONE DETECTED  NONE DETECTED   Cocaine NONE DETECTED  NONE DETECTED   Benzodiazepines NONE DETECTED  NONE DETECTED   Amphetamines NONE DETECTED  NONE DETECTED   Tetrahydrocannabinol POSITIVE (*) NONE DETECTED   Barbiturates NONE DETECTED  NONE DETECTED  CK      Result Value Ref Range   Total CK 1386 (*) 7 - 232 U/L  URINALYSIS, ROUTINE W REFLEX MICROSCOPIC      Result  Value Ref Range   Color, Urine YELLOW  YELLOW   APPearance CLEAR  CLEAR   Specific Gravity, Urine 1.010  1.005 - 1.030   pH 6.0  5.0 - 8.0   Glucose, UA NEGATIVE  NEGATIVE mg/dL   Hgb urine dipstick TRACE (*) NEGATIVE   Bilirubin Urine NEGATIVE  NEGATIVE   Ketones, ur TRACE (*) NEGATIVE mg/dL   Protein, ur NEGATIVE  NEGATIVE mg/dL   Urobilinogen, UA 0.2  0.0 - 1.0 mg/dL   Nitrite NEGATIVE  NEGATIVE   Leukocytes, UA NEGATIVE  NEGATIVE  URINE MICROSCOPIC-ADD ON      Result Value Ref Range   Squamous Epithelial / LPF FEW (*) RARE   WBC, UA 0-2  <3 WBC/hpf   RBC / HPF 0-2  <3 RBC/hpf   Bacteria, UA RARE  RARE    Laboratory interpretation all normal except minor elevation of CK. He has normal kidney function. Patient given 3 L of IV fluids in the ED. He had no vomiting while in the ED. It is felt at this point he did not need admission to the hospital.  Dg Chest 2 View  08/24/2013   CLINICAL DATA:  Chest pain and vomiting after becoming over heated.  EXAM: CHEST  2 VIEW  COMPARISON:  DG CHEST 2 VIEW dated 12/18/2010  FINDINGS: The heart size and mediastinal contours are within normal limits. Both lungs are clear. The visualized skeletal structures are unremarkable.  IMPRESSION: No active cardiopulmonary disease.   Electronically Signed   By: Burman NievesWilliam  Stevens M.D.   On: 08/24/2013 22:03     EKG Interpretation   Date/Time:  Tuesday Aug 24 2013 21:10:07 EDT Ventricular Rate:  53 PR Interval:  154 QRS Duration: 80 QT Interval:  416 QTC Calculation: 390 R Axis:  80 Text Interpretation:  Sinus bradycardia with sinus arrhythmia Otherwise  normal ECG No significant change since last tracing 18 Dec 2010 Confirmed  by [facility]  MD-I, Rual Vermeer ([id number]) on 08/24/2013 11:46:44 PM      MDM   Final diagnoses:  Heat exhaustion  Dehydration    Plan discharge  Devoria AlbeIva Madysin Crisp, MD, FACEP   I personally performed the services described in this documentation, which was scribed in my presence. The  recorded information has been reviewed and considered.  Devoria AlbeIva Koua Deeg, MD, Armando GangFACEP    Ward GivensIva L Zykira Matlack, MD 08/25/13 [phone]

## 2013-08-25 LAB — URINALYSIS, ROUTINE W REFLEX MICROSCOPIC
BILIRUBIN URINE: NEGATIVE
Glucose, UA: NEGATIVE mg/dL
LEUKOCYTES UA: NEGATIVE
NITRITE: NEGATIVE
PH: 6 (ref 5.0–8.0)
Protein, ur: NEGATIVE mg/dL
SPECIFIC GRAVITY, URINE: 1.01 (ref 1.005–1.030)
UROBILINOGEN UA: 0.2 mg/dL (ref 0.0–1.0)

## 2013-08-25 LAB — URINE MICROSCOPIC-ADD ON

## 2013-08-25 NOTE — ED Notes (Signed)
Pt alert & oriented x4, stable gait. Patient given discharge instructions, paperwork & prescription(s). Patient  instructed to stop at the registration desk to finish any additional paperwork. Patient verbalized understanding. Pt left department w/ no further questions. 

## 2013-08-25 NOTE — Discharge Instructions (Signed)
Drink plenty of fluids, like gatorade when you are working in the heat and sweating a lot. Recheck if you feel worse.    Heat Disorders Heat related disorders are illnesses caused by continued exposure to hot and humid environments, not drinking enough fluids, and/or your body failing to regulate its temperature correctly. People suffer from heat stress and heat related disorders when their bodies are unable to compensate and cool down through sweating. With sufficient heat, sweating is not enough to keep you cool, and your body temperature can rise quickly. Very high body temperatures can damage your brain and other vital organs. High humidity (moisture in the air), adds to heat stress, because it is harder for sweat to evaporate and cool your body. Heat stress and disorders are not uncommon. Some medicines can increase your risk for heat related illness. Ask your caregiver about your medicines during periods of intense heat.  Heat related disorders include:  Heatstroke. When you cannot sweat or regulate your body temperature in an adequate way. This is very dangerous and can be life threatening. Get emergency medical help.  Heat exhaustion. Overheating causes heavy sweating and a fast heart rate. Your body can still regulate its own temperature.  Heat cramps. Painful, uncontrollable muscle spasms. Can occur during heavy exercise in hot environments.  Sunburn. Skin becomes red and painful (burned) after being out in the sun.  Heat rash. Sweat ducts become blocked, which traps sweat under the skin. This causes blisters and red bumps and may cause an itchy or tingling feeling. PREVENTING HEAT STRESS AND HEAT RELATED DISORDERS Overheating can be dangerous and life threatening. When exercising, working, or doing other activities in hot and humid environments, do the following:  Stay informed by listening to and watching local broadcast weather and safety updates during intense heat.  Air  conditioning is the best way to prevent heat disorders. If your home is not air conditioned, spend time in air conditioned places (malls, [employer] libraries, or heat shelters set up by your local health department).  Wear light-weight, light colored, loose fitting clothing. Wear as little clothing as possible when at home.  Increase your fluid intake. Drink enough water and fluids to keep your urine clear or pale yellow. DO NOT WAIT UNTIL YOU ARE THIRSTY TO DRINK. You may already be heat stressed, and not recognize it.  If your caregiver has suggested that you limit the amount of fluid you drink or has prescribed water pills for a medical problem, ask how much you should drink when the weather is hot.  Do not drink liquids with alcohol, caffeine, or lots of sugar. They can cause more loss of body fluid.  Heavy sweating drains your body's salt and minerals, which must be replaced. If you must exercise in the heat, a sports beverage can replace the salt and minerals you lose in sweat. If you are on a low-salt diet, check with your caregiver before drinking a sports beverage.  Sunburn reduces your body's ability to cool itself and causes a loss of needed body fluids. If you go outdoors, protect yourself from the sun by wearing a wide-brimmed hat, along with sunglasses.  Put on sunscreen of SPF 15 or higher, 30 minutes before going out. (The most effective products say "broad spectrum" or "UVA/UVB protection" on the label.) Reapply sunscreen frequently -- at least every 1-2 hours.  Take added precautions when both the heat and humidity are high.  Rest often.  Even young and otherwise healthy people can become  heat stressed and suffer from a heat disorder, if they participate in strenuous activities during hot weather.  If you must be outdoors, try going out only during morning and evening hours, when it is cooler. Rest often in shady areas, so that your body's temperature can adjust.  If your heart  pounds or you are gasping for breath, STOP all activity. Go immediately to a cool area, or at least into the shade, and rest. This is especially true if you become lightheaded, confused, weak, or faint.  Electric fans may make you comfortable, but they DO NOT prevent heat related problems. SYMPTOMS   Headache.  Nosebleed.  Weakness.  You feel very hot.  Muscle cramps.  Restlessness.  Fainting or dizziness.  Fast breathing and shortness of breath.  Excessive sweating. (There may be little or no sweating in late stages of heat exhaustion.)  Rapid pulse, heart pounding.  Feeling sick to your stomach (nauseous, vomiting).  Skin becoming cold and clammy, or excessively hot and dry. HOME CARE INSTRUCTIONS   Lie down and rest in a cool or air conditioned area.  Drink enough water and fluids to keep your urine clear or pale yellow. Avoid fluids with caffeine or high sugar content. Avoid coffee, tea, alcohol or stimulants.  Do not take salt tablets, unless advised by your caregiver.  Avoid hot foods and heavy meals.  Bathe or shower in cool water.  Wear minimal clothing.  Use a fan. Add cool or warm mist to the air, if possible.  If possible, decrease the use of your stove or oven at home.  Monitor adults at risk at least twice a day, watching closely for signs of heat exhaustion or heat stroke. Infants and young children also require more frequent watching.  Never leave infants, children or pets in a parked car, even if the windows are cracked open.  If you are 29 years of age or older, have a friend or relative call to check on you twice a day during a heat wave. If you know someone in this age group, check on them at least twice a day. SEEK IMMEDIATE MEDICAL CARE IF:  You have a hard time breathing.  You vomit or pass blood in your stool.  You have a seizure, feel dizzy or faint, or pass out.  You develop severe sweating.  Your skin is red, hot and dry (there is  no sweating).  Your urine turns a dark color or has blood in it.  You are making very little or no urine.  You are unable to keep fluids down.  You develop chest or abdominal pain.  You develop a throbbing headache.  You develop nausea or confusion. IF YOU OBSERVE SOMEONE WHO MIGHT HAVE HEAT STROKE This can be life threatening. Call your local emergency services (911 in the [place]  If the victim is in the sun, get him or her to a shady area.  Cool the victim rapidly, using whatever methods you have:  Place the victim in a tub of cool water or a cool shower.  Spray the victim with cool water from a garden hose, or sponge the person with cool water.  Wrap the victim in a cool, wet sheet and fan them.  If emergency medical help is delayed, call the hospital emergency room or your local emergency services (911 in the [place] for further instructions.  Sometimes a victim's muscles will begin to twitch from heat stroke. If this happens, keep the victim from injuring himself.  However, do not place any object in the mouth. Give fluids, unless the muscle twitching makes it difficult or unsafe to do so. If there is vomiting, make sure the airway remains open by turning the victim on his or her side. Document Released: 03/29/2000 Document Revised: 06/24/2011 Document Reviewed: 01/16/2009 [facility] Patient Information 2014 [place], [place].

## 2014-03-14 ENCOUNTER — Emergency Department (HOSPITAL_COMMUNITY): Payer: PRIVATE HEALTH INSURANCE

## 2014-03-14 ENCOUNTER — Encounter (HOSPITAL_COMMUNITY): Payer: Self-pay | Admitting: *Deleted

## 2014-03-14 ENCOUNTER — Emergency Department (HOSPITAL_COMMUNITY)
Admission: EM | Admit: 2014-03-14 | Discharge: 2014-03-14 | Disposition: A | Discharge status: Home / Self Care | Payer: PRIVATE HEALTH INSURANCE | Attending: Emergency Medicine | Admitting: Emergency Medicine

## 2014-03-14 DIAGNOSIS — Z87891 Personal history of nicotine dependence: Secondary | ICD-10-CM | POA: Insufficient documentation

## 2014-03-14 DIAGNOSIS — Z880 Allergy status to penicillin: Secondary | ICD-10-CM | POA: Insufficient documentation

## 2014-03-14 DIAGNOSIS — E079 Disorder of thyroid, unspecified: Secondary | ICD-10-CM | POA: Insufficient documentation

## 2014-03-14 DIAGNOSIS — S93402A Sprain of unspecified ligament of left ankle, initial encounter: Secondary | ICD-10-CM

## 2014-03-14 DIAGNOSIS — Y9389 Activity, other specified: Secondary | ICD-10-CM | POA: Insufficient documentation

## 2014-03-14 DIAGNOSIS — Z862 Personal history of diseases of the blood and blood-forming organs and certain disorders involving the immune mechanism: Secondary | ICD-10-CM | POA: Insufficient documentation

## 2014-03-14 DIAGNOSIS — Z8614 Personal history of Methicillin resistant Staphylococcus aureus infection: Secondary | ICD-10-CM | POA: Insufficient documentation

## 2014-03-14 DIAGNOSIS — R52 Pain, unspecified: Secondary | ICD-10-CM

## 2014-03-14 DIAGNOSIS — X58XXXA Exposure to other specified factors, initial encounter: Secondary | ICD-10-CM | POA: Insufficient documentation

## 2014-03-14 DIAGNOSIS — Y998 Other external cause status: Secondary | ICD-10-CM | POA: Insufficient documentation

## 2014-03-14 DIAGNOSIS — Y929 Unspecified place or not applicable: Secondary | ICD-10-CM | POA: Insufficient documentation

## 2014-03-14 DIAGNOSIS — Z8679 Personal history of other diseases of the circulatory system: Secondary | ICD-10-CM | POA: Insufficient documentation

## 2014-03-14 NOTE — ED Notes (Signed)
Pt states he stepped of the porch and isn't quite sure what happened, whether he rolled his ankle or twisted it but he said he felt a sharp pain shoot up his leg.

## 2014-03-14 NOTE — Discharge Instructions (Signed)
Use the splint as needed for comfort.  Use ice on the sore area 3 times a day for 3 days. Take Advil 400 mg 3 times a day for pain.    Ankle Sprain An ankle sprain is an injury to the strong, fibrous tissues (ligaments) that hold the bones of your ankle joint together.  CAUSES An ankle sprain is usually caused by a fall or by twisting your ankle. Ankle sprains most commonly occur when you step on the outer edge of your foot, and your ankle turns inward. People who participate in sports are more prone to these types of injuries.  SYMPTOMS   Pain in your ankle. The pain may be present at rest or only when you are trying to stand or walk.  Swelling.  Bruising. Bruising may develop immediately or within 1 to 2 days after your injury.  Difficulty standing or walking, particularly when turning corners or changing directions. DIAGNOSIS  Your caregiver will ask you details about your injury and perform a physical exam of your ankle to determine if you have an ankle sprain. During the physical exam, your caregiver will press on and apply pressure to specific areas of your foot and ankle. Your caregiver will try to move your ankle in certain ways. An X-ray exam may be done to be sure a bone was not broken or a ligament did not separate from one of the bones in your ankle (avulsion fracture).  TREATMENT  Certain types of braces can help stabilize your ankle. Your caregiver can make a recommendation for this. Your caregiver may recommend the use of medicine for pain. If your sprain is severe, your caregiver may refer you to a surgeon who helps to restore function to parts of your skeletal system (orthopedist) or a physical therapist. HOME CARE INSTRUCTIONS   Apply ice to your injury for 1-2 days or as directed by your caregiver. Applying ice helps to reduce inflammation and pain.  Put ice in a plastic bag.  Place a towel between your skin and the bag.  Leave the ice on for 15-20 minutes at a time,  every 2 hours while you are awake.  Only take over-the-counter or prescription medicines for pain, discomfort, or fever as directed by your caregiver.  Elevate your injured ankle above the level of your heart as much as possible for 2-3 days.  If your caregiver recommends crutches, use them as instructed. Gradually put weight on the affected ankle. Continue to use crutches or a cane until you can walk without feeling pain in your ankle.  If you have a plaster splint, wear the splint as directed by your caregiver. Do not rest it on anything harder than a pillow for the first 24 hours. Do not put weight on it. Do not get it wet. You may take it off to take a shower or bath.  You may have been given an elastic bandage to wear around your ankle to provide support. If the elastic bandage is too tight (you have numbness or tingling in your foot or your foot becomes cold and blue), adjust the bandage to make it comfortable.  If you have an air splint, you may blow more air into it or let air out to make it more comfortable. You may take your splint off at night and before taking a shower or bath. Wiggle your toes in the splint several times per day to decrease swelling. SEEK MEDICAL CARE IF:   You have rapidly increasing bruising or  swelling.  Your toes feel extremely cold or you lose feeling in your foot.  Your pain is not relieved with medicine. SEEK IMMEDIATE MEDICAL CARE IF:  Your toes are numb or blue.  You have severe pain that is increasing. MAKE SURE YOU:   Understand these instructions.  Will watch your condition.  Will get help right away if you are not doing well or get worse. Document Released: 04/01/2005 Document Revised: 12/25/2011 Document Reviewed: 04/13/2011 [facility] Patient Information 2015 [place]. This information is not intended to replace advice given to you by your health care provider. Make sure you discuss any questions you have with your health care  provider.

## 2014-03-14 NOTE — ED Provider Notes (Signed)
CSN: [id number]     Arrival date & time 03/14/14  0531 History   None    Chief Complaint  Patient presents with  . Ankle Pain     (Consider location/radiation/quality/duration/timing/severity/associated sxs/prior Treatment) HPI   Nathan Williamson is a 28 y.o. male who twisted his left ankle as he stepped off a porch. He injured only the left ankle. He is able to walk on it. There are no other known modifying factors.  Past Medical History  Diagnosis Date  . Embolism - blood clot   . Thyroid disease   . ITP (idiopathic thrombocytopenic purpura)   . MRSA infection (methicillin-resistant Staphylococcus aureus) 2009   History reviewed. No pertinent past surgical history. History reviewed. No pertinent family history. History  Substance Use Topics  . Smoking status: Former Smoker -- 0.25 packs/day    Types: Cigarettes  . Smokeless tobacco: Not on file  . Alcohol Use: Yes    Review of Systems  All other systems reviewed and are negative.     Allergies  Penicillins  Home Medications   Prior to Admission medications   Medication Sig Start Date End Date Taking? Authorizing Provider  levothyroxine (SYNTHROID, LEVOTHROID) 175 MCG tablet Take 175 mcg by mouth daily.     Historical Provider, MD   BP 117/71 mmHg  Pulse 76  Temp(Src) 98.5 F (36.9 C) (Oral)  Resp 20  Ht 5' 9.5" (1.765 m)  Wt 180 lb (81.647 kg)  BMI 26.21 kg/m2  SpO2 100% Physical Exam  Constitutional: He is oriented to person, place, and time. He appears well-developed and well-nourished.  HENT:  Head: Normocephalic and atraumatic.  Right Ear: External ear normal.  Left Ear: External ear normal.  Eyes: Conjunctivae and EOM are normal.  Neck: Normal range of motion and phonation normal. Neck supple.  Cardiovascular: Normal rate.   Pulmonary/Chest: Effort normal. He exhibits no bony tenderness.  Musculoskeletal: Normal range of motion.  Left ankle is tender at lateral malleolus with mild swelling. No  gross instability or deformity. No left knee tenderness.  Neurological: He is alert and oriented to person, place, and time. No cranial nerve deficit or sensory deficit. He exhibits normal muscle tone. Coordination normal.  Skin: Skin is warm, dry and intact.  Psychiatric: He has a normal mood and affect. His behavior is normal. Judgment and thought content normal.  Nursing note and vitals reviewed.   ED Course  Procedures (including critical care time)   Findings discussed with patient, all questions answered.  Labs Review Labs Reviewed - No data to display  Imaging Review No results found.   EKG Interpretation None      MDM   Final diagnoses:  Pain  Ankle sprain, left, initial encounter    Ankle sprain. No fracture.  Nursing Notes Reviewed/ Care Coordinated Applicable Imaging Reviewed Interpretation of Laboratory Data incorporated into ED treatment  The patient appears reasonably screened and/or stabilized for discharge and I doubt any other medical condition or other [facility] requiring further screening, evaluation, or treatment in the ED at this time prior to discharge.  Plan: Home Medications- Advil; Home Treatments- Rest, elevation, ice; return here if the recommended treatment, does not improve the symptoms; Recommended follow up- PCP prn     Flint MelterElliott L Donella Pascarella, MD 03/14/14 [phone]

## 2017-04-01 DIAGNOSIS — Z719 Counseling, unspecified: Secondary | ICD-10-CM | POA: Diagnosis not present

## 2017-04-01 DIAGNOSIS — Z6828 Body mass index (BMI) 28.0-28.9, adult: Secondary | ICD-10-CM | POA: Diagnosis not present

## 2017-04-01 DIAGNOSIS — E039 Hypothyroidism, unspecified: Secondary | ICD-10-CM | POA: Diagnosis not present

## 2017-04-01 DIAGNOSIS — E782 Mixed hyperlipidemia: Secondary | ICD-10-CM | POA: Diagnosis not present

## 2017-04-01 DIAGNOSIS — R7309 Other abnormal glucose: Secondary | ICD-10-CM | POA: Diagnosis not present

## 2017-04-01 DIAGNOSIS — E663 Overweight: Secondary | ICD-10-CM | POA: Diagnosis not present

## 2017-04-01 DIAGNOSIS — Z1389 Encounter for screening for other disorder: Secondary | ICD-10-CM | POA: Diagnosis not present

## 2019-05-24 ENCOUNTER — Emergency Department (HOSPITAL_BASED_OUTPATIENT_CLINIC_OR_DEPARTMENT_OTHER)
Admission: EM | Admit: 2019-05-24 | Discharge: 2019-05-25 | Disposition: A | Discharge status: Home / Self Care | Payer: No Typology Code available for payment source | Attending: Emergency Medicine | Admitting: Emergency Medicine

## 2019-05-24 ENCOUNTER — Other Ambulatory Visit: Payer: Self-pay

## 2019-05-24 ENCOUNTER — Emergency Department (HOSPITAL_BASED_OUTPATIENT_CLINIC_OR_DEPARTMENT_OTHER): Payer: No Typology Code available for payment source

## 2019-05-24 ENCOUNTER — Encounter (HOSPITAL_BASED_OUTPATIENT_CLINIC_OR_DEPARTMENT_OTHER): Payer: Self-pay | Admitting: *Deleted

## 2019-05-24 DIAGNOSIS — Y9389 Activity, other specified: Secondary | ICD-10-CM | POA: Diagnosis not present

## 2019-05-24 DIAGNOSIS — Y929 Unspecified place or not applicable: Secondary | ICD-10-CM | POA: Insufficient documentation

## 2019-05-24 DIAGNOSIS — W25XXXA Contact with sharp glass, initial encounter: Secondary | ICD-10-CM | POA: Diagnosis not present

## 2019-05-24 DIAGNOSIS — W010XXA Fall on same level from slipping, tripping and stumbling without subsequent striking against object, initial encounter: Secondary | ICD-10-CM | POA: Insufficient documentation

## 2019-05-24 DIAGNOSIS — S61411A Laceration without foreign body of right hand, initial encounter: Secondary | ICD-10-CM | POA: Diagnosis present

## 2019-05-24 DIAGNOSIS — Y999 Unspecified external cause status: Secondary | ICD-10-CM | POA: Diagnosis not present

## 2019-05-24 MED ORDER — IBUPROFEN 800 MG PO TABS
800.0000 mg | ORAL_TABLET | Freq: Once | ORAL | Status: AC
  Administered 2019-05-24: 800 mg via ORAL
  Filled 2019-05-24: qty 1

## 2019-05-24 MED ORDER — LIDOCAINE HCL 2 % IJ SOLN
INTRAMUSCULAR | Status: AC
  Administered 2019-05-25: 400 mg via INTRADERMAL
  Filled 2019-05-24: qty 20

## 2019-05-24 MED ORDER — LIDOCAINE-EPINEPHRINE (PF) 2 %-1:200000 IJ SOLN
INTRAMUSCULAR | Status: AC
  Filled 2019-05-24: qty 10

## 2019-05-24 MED ORDER — LIDOCAINE HCL 2 % IJ SOLN
20.0000 mL | Freq: Once | INTRAMUSCULAR | Status: AC
  Filled 2019-05-24: qty 20

## 2019-05-24 NOTE — ED Provider Notes (Signed)
TIME SEEN: 11:04 PM  CHIEF COMPLAINT: Right hand laceration  HPI: Patient is a 34 year old right-hand-dominant male who presents to the emergency department after he fell lacerating the ulnar aspect of the right hand.  States he was outside playing around with friends when he slipped on a wet spot and landed on the ground.  States he cut it on a piece of glass. States his last tetanus vaccination was in 2018. Complaining of lateral right hand pain as well as wrist pain up to the mid forearm. No obvious deformity. Currently has numbness throughout this area after being anesthetized. No other numbness or tingling. Full range of motion in all fingers. Did not hit his head when he fell or lose consciousness.  No other injury.  No neck or back pain.  ROS: See HPI Constitutional: no fever  Eyes: no drainage  ENT: no runny nose   Cardiovascular:  no chest pain  Resp: no SOB  GI: no vomiting GU: no dysuria Integumentary: no rash  Allergy: no hives  Musculoskeletal: no leg swelling  Neurological: no slurred speech ROS otherwise negative  PAST MEDICAL HISTORY/PAST SURGICAL HISTORY:  Past Medical History:  Diagnosis Date  . Embolism - blood clot   . ITP (idiopathic thrombocytopenic purpura)   . MRSA infection (methicillin-resistant Staphylococcus aureus) 2009  . Thyroid disease     MEDICATIONS:  Prior to Admission medications   Medication Sig Start Date End Date Taking? Authorizing Provider  levothyroxine (SYNTHROID, LEVOTHROID) 175 MCG tablet Take 175 mcg by mouth daily.    Yes [provider]    ALLERGIES:  Allergies  Allergen Reactions  . Penicillins Anaphylaxis    SOCIAL HISTORY:  Social History   Tobacco Use  . Smoking status: Former Smoker    Packs/day: 0.25    Types: Cigarettes  . Smokeless tobacco: Never Used  Substance Use Topics  . Alcohol use: Yes    FAMILY HISTORY: No family history on file.  EXAM: BP 137/84   Pulse (!) 55   Temp 98.5 F (36.9 C)  (Oral)   Resp 20   Ht 5\' 9"  (1.753 m)   Wt 97.1 kg   SpO2 100%   BMI 31.60 kg/m  CONSTITUTIONAL: Alert and oriented and responds appropriately to questions. Well-appearing; well-nourished HEAD: Normocephalic, atraumatic EYES: Conjunctivae clear, pupils appear equal, EOM appear intact ENT: normal nose; moist mucous membranes NECK: Supple, normal ROM, no midline spinal tenderness or step-off or deformity CARD: RRR; S1 and S2 appreciated; no murmurs, no clicks, no rubs, no gallops RESP: Normal chest excursion without splinting or tachypnea; breath sounds clear and equal bilaterally; no wheezes, no rhonchi, no rales, no hypoxia or respiratory distress, speaking full sentences ABD/GI: Normal bowel sounds; non-distended; soft, non-tender, no rebound, no guarding, no peritoneal signs, no hepatosplenomegaly BACK:  The back appears normal, no midline spinal tenderness or step-off or deformity EXT: Normal ROM in all joints; no deformity noted, no edema; no cyanosis, 2+ right radial pulse, normal capillary refill in the fingers of the right hand, normal range of motion in his right hand and right wrist, diminished sensation over the lateral aspect of the right hand after receiving lidocaine compartments of the right arm soft, no joint effusion SKIN: Normal color for age and race; warm; no rash on exposed skin, 4 cm laceration to the ulnar aspect of the right hand without obvious tendon involvement and no obvious foreign body NEURO: Moves all extremities equally PSYCH: The patient's mood and manner are appropriate.  MEDICAL DECISION MAKING: Prior to me seeing the patient, patient has already been anesthetized with 4 mL of lidocaine 1% without epinephrine. Irrigated with a liter of saline. X-rays pending.  ED PROGRESS: X-ray shows no acute abnormality.  X-ray includes enough of the wrist and forearm to exclude fracture.  Laceration repaired.  Wound care instructions and return precautions discussed.  He  will follow up with his PCP in 1 week for suture removal.  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   LACERATION REPAIR Performed by: Pryor Curia Authorized by: Pryor Curia Consent: Verbal consent obtained. Risks and benefits: risks, benefits and alternatives were discussed Consent given by: patient Patient identity confirmed: provided demographic data Prepped and Draped in normal sterile fashion Wound explored  Laceration Location: Right hand  Laceration Length: 4 cm  No Foreign Bodies seen or palpated  Anesthesia: local infiltration  Local anesthetic: lidocaine 2% without epinephrine  Anesthetic total: 7 ml total (4 ml by Alecia Lemming, PA and 18ml by myself)  Irrigation method: syringe Amount of cleaning: standard  Skin closure: Superficial  Number of sutures: 6  Technique: Area anesthetized using lidocaine 2% without epinephrine. Wound irrigated copiously with sterile saline. Wound then cleaned with Betadine and draped in sterile fashion. Wound closed using 6 simple interrupted sutures with 4-0 Prolene.  Bacitracin and sterile dressing applied. Good wound approximation and hemostasis achieved.      Patient tolerance: Patient tolerated the procedure well with no immediate complications.    XAVIAR LUNN was evaluated in Emergency Department on 05/24/2019 for the symptoms described in the history of present illness. He was evaluated in the context of the global COVID-19 pandemic, which necessitated consideration that the patient might be at risk for infection with the SARS-CoV-2 virus that causes COVID-19. Institutional protocols and algorithms that pertain to the evaluation of patients at risk for COVID-19 are in a state of rapid change based on information released by regulatory bodies including the CDC and federal and state organizations. These policies and algorithms were followed during the patient's care in the ED.  Patient was seen wearing N95, face  shield, gloves.    Liev Brockbank, Delice Bison, DO 05/24/19 [id number]

## 2019-05-24 NOTE — Discharge Instructions (Addendum)
You may alternate Tylenol 1000 mg every 6 hours as needed for pain and Ibuprofen 800 mg every 8 hours as needed for pain.  Please take Ibuprofen with food.  Do not take more than 4000 mg of Tylenol (acetaminophen) in a 24 hour period.   You may clean your wound gently with warm soap and water twice a day.  I recommend applying over-the-counter triple antibiotic ointment or Neosporin twice a day and covering with a sterile bandage until completely healed.   If you develop redness, warmth, drainage of pus from this area or fever of 100.4 or higher, please return to the emergency department or see your primary care physician immediately.

## 2019-05-24 NOTE — ED Provider Notes (Addendum)
MSE was initiated and I personally evaluated the patient and placed orders (if any) at  11:21 PM on May 24, 2019.  The patient appears stable so that the remainder of the MSE may be completed by another provider.  Patient presents with laceration to the right hand after falling on some glass.  Patient seen and evaluated on arrival.  X-ray was ordered.  Wound was anesthetized on arrival with approximately 4 cc of lidocaine without epinephrine.  Wound was irrigated with 1 L of normal saline under pressure.  There was residual bleeding from a small arteriole which was controlled with several minutes of direct pressure.  Patient to x-ray.    Renne Crigler, PA-C 05/24/19 [id number]    Ward, Layla Maw, DO 05/25/19 0002

## 2019-05-24 NOTE — ED Triage Notes (Signed)
Laceration and injury to his right hand. He was running and fell into broken glass on the ground.

## 2019-06-01 ENCOUNTER — Encounter (HOSPITAL_BASED_OUTPATIENT_CLINIC_OR_DEPARTMENT_OTHER): Payer: Self-pay

## 2019-06-01 ENCOUNTER — Other Ambulatory Visit: Payer: Self-pay

## 2019-06-01 ENCOUNTER — Emergency Department (HOSPITAL_BASED_OUTPATIENT_CLINIC_OR_DEPARTMENT_OTHER)
Admission: EM | Admit: 2019-06-01 | Discharge: 2019-06-01 | Disposition: A | Discharge status: Home / Self Care | Payer: No Typology Code available for payment source | Attending: Emergency Medicine | Admitting: Emergency Medicine

## 2019-06-01 DIAGNOSIS — Z79899 Other long term (current) drug therapy: Secondary | ICD-10-CM | POA: Insufficient documentation

## 2019-06-01 DIAGNOSIS — S61412D Laceration without foreign body of left hand, subsequent encounter: Secondary | ICD-10-CM | POA: Diagnosis present

## 2019-06-01 DIAGNOSIS — X58XXXD Exposure to other specified factors, subsequent encounter: Secondary | ICD-10-CM | POA: Insufficient documentation

## 2019-06-01 DIAGNOSIS — Z87891 Personal history of nicotine dependence: Secondary | ICD-10-CM | POA: Insufficient documentation

## 2019-06-01 DIAGNOSIS — Z5189 Encounter for other specified aftercare: Secondary | ICD-10-CM

## 2019-06-01 NOTE — ED Triage Notes (Signed)
Pt states his PCP removed sutures from right hand lac from 2/8-pt states area is opening-NAD-steady gait

## 2019-06-01 NOTE — Discharge Instructions (Signed)
Keep the glue on your hand dry for 24 hours.  Afterwards you can wash and shower normally.  The glue will naturally dissolve in about 7-10 days, or sooner.  The wound should be healed by then.

## 2019-06-01 NOTE — ED Provider Notes (Signed)
[facility] EMERGENCY DEPARTMENT Provider Note   CSN: [id number] Arrival date & time: 06/01/19  1743     History Chief Complaint  Patient presents with  . Follow-up    Nathan Williamson is a 34 y.o. male to the emergency department for wound reevaluation.  He sustained a laceration to his left hand 8 days ago on 2/8, was seen in our Ed and had stitches placed.  He went this primary care doctor's office today and had the stitches removed.  He is concerned the stitches were taken out too early.  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  HPI     Past Medical History:  Diagnosis Date  . Embolism - blood clot   . ITP (idiopathic thrombocytopenic purpura)   . MRSA infection (methicillin-resistant Staphylococcus aureus) 2009  . Thyroid disease     Patient Active Problem List   Diagnosis Date Noted  . Chest pain 12/09/2010  . Cardiac enzymes elevated 12/09/2010  . Bradycardia 12/09/2010    History reviewed. No pertinent surgical history.     No family history on file.  Social History   Tobacco Use  . Smoking status: Former Smoker    Packs/day: 0.25    Types: Cigarettes  . Smokeless tobacco: Never Used  Substance Use Topics  . Alcohol use: Yes    Comment: occ  . Drug use: Yes    Frequency: 2.0 times per week    Types: Marijuana    Home Medications Prior to Admission medications   Medication Sig Start Date End Date Taking? Authorizing Provider  levothyroxine (SYNTHROID, LEVOTHROID) 175 MCG tablet Take 175 mcg by mouth daily.     [provider]    Allergies    Penicillins  Review of Systems   Review of Systems  Constitutional: Negative for chills and fever.  Musculoskeletal: Positive for arthralgias and myalgias.  Skin: Negative for pallor and rash.  Neurological: Negative for syncope and headaches.  All other systems reviewed and are  negative.   Physical Exam Updated Vital Signs BP (!) 116/97 (BP Location: Left Arm)   Pulse 83   Temp 98.6 F (37 C) (Oral)   Resp 16   SpO2 99%   Physical Exam Vitals and nursing note reviewed.  Constitutional:      Appearance: He is well-developed.  HENT:     Head: Normocephalic and atraumatic.  Eyes:     Conjunctiva/sclera: Conjunctivae normal.  Cardiovascular:     Rate and Rhythm: Normal rate and regular rhythm.     Pulses: Normal pulses.  Skin:    General: Skin is warm and dry.     Comments: 2 cm curvilinear laceration to base dorsum of left hand, stitches recently removed, wound has not fully closed, no complete dehiscence, no active bleed, no surrounding erythema  Neurological:     Mental Status: He is alert.     ED Results / Procedures / Treatments   Labs (all labs ordered are listed, but only abnormal results are displayed) Labs Reviewed - No data to display  EKG None  Radiology No results found.  Procedures .Marland KitchenLaceration Repair  Date/Time: 06/01/2019 8:11 PM Performed by: Wyvonnia Dusky, MD Authorized by: Wyvonnia Dusky, MD   Consent:    Consent obtained:  Verbal   Consent given by:  Patient Anesthesia (see MAR for exact dosages):    Anesthesia method:  None Laceration details:    Location:  Hand   Hand location:  L hand, dorsum   Length (cm):  2   Depth (mm):  2 Repair type:    Repair type:  Simple Pre-procedure details:    Preparation:  Patient was prepped and draped in usual sterile fashion Skin repair:    Repair method:  Tissue adhesive Approximation:    Approximation:  Loose Post-procedure details:    Dressing:  Open (no dressing)   Patient tolerance of procedure:  Tolerated well, no immediate complications   (including critical care time)  Medications Ordered in ED Medications - No data to display  ED Course  I have reviewed the triage vital signs and the nursing notes.  Pertinent labs & imaging results that were  available during my care of the patient were reviewed by me and considered in my medical decision making (see chart for details).  34 yo male presenting with laceration from 8 days ago, had stitches removed today in PCP's office, and coming into Ed concerned that his wound hadn't fully healed.  He is worried about returning to work in 3 days at an [employer], stating there's a lot of heavy lifting, he is worried the wound may pull apart.  There is partial but not complete dehiscence of the dorsal hand wound, without sign of infection.  I doubt sutures would be helpful at this point.  It will heal by secondary intention within a week, I suspect, but we'll apply some dermabond to ensure it doesn't come apart in the meantime.  I'll write him for light duties through the weekend, then to return to regular work schedule by Monday.  He verbalizes understanding.  Final Clinical Impression(s) / ED Diagnoses Final diagnoses:  Encounter for wound care    Rx / DC Orders ED Discharge Orders    None       Terald Sleeper, MD 06/01/19 2013

## 2022-04-02 ENCOUNTER — Emergency Department (HOSPITAL_COMMUNITY)
Admission: EM | Admit: 2022-04-02 | Discharge: 2022-04-02 | Disposition: A | Discharge status: Home / Self Care | Payer: Self-pay | Attending: Emergency Medicine | Admitting: Emergency Medicine

## 2022-04-02 ENCOUNTER — Emergency Department (HOSPITAL_COMMUNITY): Payer: Self-pay

## 2022-04-02 ENCOUNTER — Encounter (HOSPITAL_COMMUNITY): Payer: Self-pay

## 2022-04-02 ENCOUNTER — Other Ambulatory Visit: Payer: Self-pay

## 2022-04-02 DIAGNOSIS — S60221A Contusion of right hand, initial encounter: Secondary | ICD-10-CM | POA: Insufficient documentation

## 2022-04-02 DIAGNOSIS — W230XXA Caught, crushed, jammed, or pinched between moving objects, initial encounter: Secondary | ICD-10-CM | POA: Insufficient documentation

## 2022-04-02 NOTE — ED Triage Notes (Signed)
Patient states he smashed his hand between a door and a chair. Patient states increased pain when closing right fist.

## 2022-04-02 NOTE — ED Provider Notes (Signed)
[facility] EMERGENCY DEPARTMENT Provider Note   CSN: [id number] Arrival date & time: 04/02/22  1551     History  Chief Complaint  Patient presents with   Hand Injury    Nathan Williamson is a 36 y.o. male.   Hand Injury Associated symptoms: no fever and no neck pain        Nathan Williamson is a 36 y.o. male who presents to the Emergency Department complaining of crush injury of his right hand.  He was moving a sofa when his hand was smashed between the sofa and the door.  Incident occurred shortly before your arrival.  He complains of pain to the dorsal aspect of his proximal hand and extends into his wrist.  Is worse with gripping and flexing his wrist.  He denies any numbness or tingling of his hand or fingers.  No open wounds or bleeding.  Discoloration or injury of the distal fingers.  Home Medications Prior to Admission medications   Medication Sig Start Date End Date Taking? Authorizing Provider  levothyroxine (SYNTHROID, LEVOTHROID) 175 MCG tablet Take 175 mcg by mouth daily.     [provider]      Allergies    Penicillins    Review of Systems   Review of Systems  Constitutional:  Negative for fever.  Gastrointestinal:  Negative for nausea and vomiting.  Musculoskeletal:  Positive for arthralgias (Right hand pain). Negative for neck pain.  Skin:  Negative for color change, rash and wound.  Neurological:  Negative for dizziness, weakness and numbness.    Physical Exam Updated Vital Signs BP 127/81 (BP Location: Right Arm)   Pulse 62   Temp 98.1 F (36.7 C) (Oral)   Resp 20   Ht 5\' 9"  (1.753 m)   Wt 97.5 kg   SpO2 98%   BMI 31.75 kg/m  Physical Exam Vitals and nursing note reviewed.  Constitutional:      General: He is not in acute distress.    Appearance: Normal appearance. He is not toxic-appearing.  Cardiovascular:     Rate and Rhythm: Normal rate and regular rhythm.     Pulses: Normal pulses.  Pulmonary:     Effort: Pulmonary effort is  normal.  Musculoskeletal:        General: Tenderness and signs of injury present.     Right hand: Swelling present. No deformity, lacerations, tenderness or bony tenderness. Normal strength. Normal sensation. There is no disruption of two-point discrimination. Normal capillary refill. Normal pulse.     Comments: Mild soft tissue swelling of the dorsal aspect of the right hand.  No bony deformities.  Patient able to perform full range of motion of the fingers of the right hand.  Mild wrist tenderness with range of motion, no obvious bony deformity or edema noted of the right wrist.  Compartments are soft.  Skin:    General: Skin is warm.     Capillary Refill: Capillary refill takes less than 2 seconds.     Findings: No bruising or erythema.  Neurological:     General: No focal deficit present.     Mental Status: He is alert.     Motor: No weakness.     ED Results / Procedures / Treatments   Labs (all labs ordered are listed, but only abnormal results are displayed) Labs Reviewed - No data to display  EKG None  Radiology DG Hand Complete Right  Result Date: 04/02/2022 CLINICAL DATA:  Injury.  Something fell on  hand today. EXAM: RIGHT WRIST - COMPLETE 3+ VIEW; RIGHT HAND - COMPLETE 3+ VIEW COMPARISON:  Right hand radiographs 05/24/2019 FINDINGS: Interval healing of the previously seen hypothenar soft tissue laceration. Normal bone mineralization.  Neutral ulnar variance. Minimal peripheral degenerative osteophytes of the thumb carpometacarpal joint. No significant joint space narrowing. No radiopaque foreign body. IMPRESSION: Interval healing of the previously seen hypothenar soft tissue laceration on prior remote 05/24/2019 radiographs. No acute fracture or dislocation. Electronically Signed   By: Yvonne Kendall M.D.   On: 04/02/2022 17:08   DG Wrist Complete Right  Result Date: 04/02/2022 CLINICAL DATA:  Injury.  Something fell on hand today. EXAM: RIGHT WRIST - COMPLETE 3+ VIEW; RIGHT  HAND - COMPLETE 3+ VIEW COMPARISON:  Right hand radiographs 05/24/2019 FINDINGS: Interval healing of the previously seen hypothenar soft tissue laceration. Normal bone mineralization.  Neutral ulnar variance. Minimal peripheral degenerative osteophytes of the thumb carpometacarpal joint. No significant joint space narrowing. No radiopaque foreign body. IMPRESSION: Interval healing of the previously seen hypothenar soft tissue laceration on prior remote 05/24/2019 radiographs. No acute fracture or dislocation. Electronically Signed   By: Yvonne Kendall M.D.   On: 04/02/2022 17:08    Procedures Procedures    Medications Ordered in ED Medications - No data to display  ED Course/ Medical Decision Making/ A&P                           Medical Decision Making Patient here for evaluation of crush injury of his right hand.  Hand was passed between a sofa and a door.  No injury of the fingers.  He complains of throbbing pain to the dorsal hand and wrist.  On exam, he has diffuse tenderness to the dorsal aspect of the hand without evidence of bony deformities open wounds, abrasions or ecchymosis.  No bony deformities or noticeable edema of the right wrist.  I suspect this is related to contusion, fracture also considered but felt less likely.  Compartments are soft, doubt compartment syndrome.  Amount and/or Complexity of Data Reviewed Radiology: ordered.    Details: X-rays ordered of the right hand and wrist without evidence of acute bony injury Discussion of management or test interpretation with external provider(s): Contusion of the hand and wrist, patient agreeable to RICE therapy.  Brace supplied for support, I have recommended over-the-counter pain reliever as directed in orthopedic follow-up in 1 to 2 weeks if needed.           Final Clinical Impression(s) / ED Diagnoses Final diagnoses:  Contusion of right hand, initial encounter    Rx / DC Orders ED Discharge Orders     None          Kem Parkinson, PA-C 04/02/22 1856    Godfrey Pick, MD 04/03/22 0104

## 2022-04-02 NOTE — Discharge Instructions (Signed)
The x-ray of your wrist and hand did not show any broken bones or dislocations.  You do have some swelling there which is likely related to a contusion of your hand.  I recommend that you elevate and apply ice packs on and off to your hand.  Wear the brace for support you may remove at bedtime and for bathing.  You may take over-the counter ibuprofen 600 mg 3 times a day with food if needed for pain.  Please follow-up with the orthopedic provider listed in 1 to 2 weeks if your symptoms or not improving

## 2022-04-05 ENCOUNTER — Encounter: Payer: Self-pay | Admitting: Orthopedic Surgery

## 2022-04-05 ENCOUNTER — Ambulatory Visit (INDEPENDENT_AMBULATORY_CARE_PROVIDER_SITE_OTHER): Payer: Self-pay | Admitting: Orthopedic Surgery

## 2022-04-05 VITALS — BP 132/82 | HR 80 | Ht 69.0 in | Wt 219.0 lb

## 2022-04-05 DIAGNOSIS — S60221A Contusion of right hand, initial encounter: Secondary | ICD-10-CM

## 2022-04-05 MED ORDER — PREDNISONE 10 MG (21) PO TBPK: ORAL_TABLET | ORAL | 0 refills | Status: DC

## 2022-04-05 NOTE — Progress Notes (Signed)
New Patient Visit  Assessment: Nathan Williamson is a 36 y.o. male with the following: 1. Contusion of right hand, initial encounter  Plan: Arvella Merles slammed his right hand between a door and a piece of furniture.  No lacerations.  No fractures.  The swelling has considerably improved since the day of the injury.  He cannot take ibuprofen, so I provided him with prednisone Dosepak.  Continue use the brace as needed.  Work on gentle range of motion.  Provided a letter for work for the next week.  If he continues to struggle, I would recommend hand therapy.  Otherwise, follow-up as needed.  Follow-up: Return if symptoms worsen or fail to improve.  Subjective:  Chief Complaint  Patient presents with   Hand Injury    Rt hand DOI 04/02/22    History of Present Illness: Nathan Williamson is a 36 y.o. male who presents for evaluation of right hand pain.  Few days ago, he was helping to move a piece of furniture.  The furniture slipped, and slammed into his hand, which was wedged between the furniture and a door frame.  This caused immediate pain.  He a lot of swelling.  He presented to the ED.  The swelling was severe enough, that they got x-rays.  X-rays were negative.  They have given him a brace.  He has not been taking any medications.  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   Review of Systems: No fevers or chills No numbness or tingling No chest pain No shortness of breath No bowel or bladder dysfunction No GI distress No headaches   Medical History:  Past Medical History:  Diagnosis Date   Embolism - blood clot    ITP (idiopathic thrombocytopenic purpura)    MRSA infection (methicillin-resistant Staphylococcus aureus) 2009   Thyroid disease     History reviewed. No pertinent surgical history.  History reviewed. No pertinent family history. Social History   Tobacco Use   Smoking status: Former    Packs/day: 0.25    Types: Cigarettes    Quit date:  2018    Years since quitting: 5.9   Smokeless tobacco: Never  Vaping Use   Vaping Use: Never used  Substance Use Topics   Alcohol use: Not Currently    Comment: occ   Drug use: Yes    Frequency: 2.0 times per week    Types: Marijuana    Allergies  Allergen Reactions   Penicillins Anaphylaxis    Current Meds  Medication Sig   levothyroxine (SYNTHROID, LEVOTHROID) 175 MCG tablet Take 175 mcg by mouth daily.    predniSONE (STERAPRED UNI-PAK 21 TAB) 10 MG (21) TBPK tablet 10 mg DS 12 as directed    Objective: BP 132/82   Pulse 80   Ht 5\' 9"  (1.753 m)   Wt 219 lb (99.3 kg)   BMI 32.34 kg/m   Physical Exam:  General: Alert and oriented. and No acute distress. Gait: Normal gait.  Right hand with mild swelling.  Fingers warm and well-perfused.  Minimal bruising is appreciated.  Mild tenderness palpation over the middle of the hand.  Almost able to make a full fist.  Sensation intact throughout the right hand.  2+ radial pulse.  IMAGING: I personally reviewed images previously obtained from the ED   X-rays of the right hand and the wrist were obtained in the emergency department.  These were negative for acute injuries.   New Medications:  Meds ordered this encounter  Medications   predniSONE (STERAPRED UNI-PAK 21 TAB) 10 MG (21) TBPK tablet    Sig: 10 mg DS 12 as directed    Dispense:  48 tablet    Refill:  0      Oliver Barre, MD  04/05/2022 9:13 AM

## 2022-04-05 NOTE — Patient Instructions (Signed)
Note for work, out of work for another week, can return starting 04/13/22

## 2022-08-19 DIAGNOSIS — Z1331 Encounter for screening for depression: Secondary | ICD-10-CM | POA: Diagnosis not present

## 2022-08-19 DIAGNOSIS — E039 Hypothyroidism, unspecified: Secondary | ICD-10-CM | POA: Diagnosis not present

## 2022-08-19 DIAGNOSIS — E6609 Other obesity due to excess calories: Secondary | ICD-10-CM | POA: Diagnosis not present

## 2022-08-19 DIAGNOSIS — Z0000 Encounter for general adult medical examination without abnormal findings: Secondary | ICD-10-CM | POA: Diagnosis not present

## 2022-08-19 DIAGNOSIS — Z6832 Body mass index (BMI) 32.0-32.9, adult: Secondary | ICD-10-CM | POA: Diagnosis not present

## 2022-08-19 DIAGNOSIS — E030 Congenital hypothyroidism with diffuse goiter: Secondary | ICD-10-CM | POA: Diagnosis not present

## 2022-11-13 ENCOUNTER — Other Ambulatory Visit: Payer: Self-pay

## 2022-11-13 ENCOUNTER — Encounter (HOSPITAL_COMMUNITY): Payer: Self-pay

## 2022-11-13 ENCOUNTER — Emergency Department (HOSPITAL_COMMUNITY): Payer: BC Managed Care – PPO

## 2022-11-13 ENCOUNTER — Emergency Department (HOSPITAL_COMMUNITY)
Admission: EM | Admit: 2022-11-13 | Discharge: 2022-11-13 | Disposition: A | Discharge status: Home / Self Care | Payer: BC Managed Care – PPO | Attending: Emergency Medicine | Admitting: Emergency Medicine

## 2022-11-13 DIAGNOSIS — R0789 Other chest pain: Secondary | ICD-10-CM | POA: Insufficient documentation

## 2022-11-13 DIAGNOSIS — M25512 Pain in left shoulder: Secondary | ICD-10-CM | POA: Insufficient documentation

## 2022-11-13 DIAGNOSIS — M7989 Other specified soft tissue disorders: Secondary | ICD-10-CM | POA: Diagnosis not present

## 2022-11-13 DIAGNOSIS — M79602 Pain in left arm: Secondary | ICD-10-CM | POA: Diagnosis not present

## 2022-11-13 DIAGNOSIS — R079 Chest pain, unspecified: Secondary | ICD-10-CM | POA: Diagnosis not present

## 2022-11-13 DIAGNOSIS — M79642 Pain in left hand: Secondary | ICD-10-CM | POA: Diagnosis not present

## 2022-11-13 LAB — BASIC METABOLIC PANEL
Anion gap: 10 (ref 5–15)
BUN: 24 mg/dL — ABNORMAL HIGH (ref 6–20)
CO2: 23 mmol/L (ref 22–32)
Calcium: 8.9 mg/dL (ref 8.9–10.3)
Chloride: 102 mmol/L (ref 98–111)
Creatinine, Ser: 1.24 mg/dL (ref 0.61–1.24)
GFR, Estimated: >60 mL/min (ref >60)
Glucose, Bld: 86 mg/dL (ref 70–99)
Potassium: 4.1 mmol/L (ref 3.5–5.1)
Sodium: 135 mmol/L (ref 135–145)

## 2022-11-13 LAB — CBC
HCT: 40.4 % (ref 39.0–52.0)
Hemoglobin: 13.2 g/dL (ref 13.0–17.0)
MCH: 26.7 pg (ref 26.0–34.0)
MCHC: 32.7 g/dL (ref 30.0–36.0)
MCV: 81.8 fL (ref 80.0–100.0)
Platelets: 242 10*3/uL (ref 150–400)
RBC: 4.94 MIL/uL (ref 4.22–5.81)
RDW: 13 % (ref 11.5–15.5)
WBC: 9.7 10*3/uL (ref 4.0–10.5)
nRBC: 0 % (ref 0.0–0.2)

## 2022-11-13 LAB — TROPONIN I (HIGH SENSITIVITY)
Troponin I (High Sensitivity): 5 ng/L (ref <18)
Troponin I (High Sensitivity): 5 ng/L (ref <18)

## 2022-11-13 MED ORDER — IBUPROFEN 600 MG PO TABS: 600.0000 mg | ORAL_TABLET | Freq: Three times a day (TID) | ORAL | 0 refills | Status: DC

## 2022-11-13 MED ORDER — KETOROLAC TROMETHAMINE 30 MG/ML IJ SOLN
15.0000 mg | Freq: Once | INTRAMUSCULAR | Status: AC
  Administered 2022-11-13: 15 mg via INTRAVENOUS
  Filled 2022-11-13: qty 1

## 2022-11-13 NOTE — ED Provider Notes (Signed)
[place] EMERGENCY DEPARTMENT AT [facility] Provider Note   CSN: [id number] Arrival date & time: 11/13/22  1442     History  Chief Complaint  Patient presents with   Chest Pain    Nathan Williamson is a 37 y.o. male presenting for evaluation of left arm, shoulder and chest pain.  He woke late last night and went to get something to drink.  When he reached into the fridge he discovered signifcant pain at his anterior upper chest, radiating through his axilla to his posterior shoulder.  He denies injury, but works in a physical job. Describes pushing a large trash container up an incline ytd but this is not a new activity.  He also helped dig a drain line at his church 2 days ago.  He cannot take nsaids due to history of ITP, therefore has had no tx prior to arrival.   He also notes an isolated swelling left hand after pushing the trash container today.   He denies rash,  fever or chills, but was diaphoretic prior to arrival today.  No n/v, palpitations, sob.   The history is provided by the patient.       Home Medications Prior to Admission medications   Medication Sig Start Date End Date Taking? Authorizing Provider  ibuprofen (ADVIL) 600 MG tablet Take 1 tablet (600 mg total) by mouth 3 (three) times daily. 11/13/22  Yes Avanelle Pixley, Raynelle Fanning, PA-C  levothyroxine (SYNTHROID, LEVOTHROID) 175 MCG tablet Take 175 mcg by mouth daily.     [provider]  predniSONE (STERAPRED UNI-PAK 21 TAB) 10 MG (21) TBPK tablet 10 mg DS 12 as directed 04/05/22   Oliver Barre, MD      Allergies    Penicillins    Review of Systems   Review of Systems  Constitutional:  Positive for diaphoresis. Negative for fever.  Respiratory: Negative.    Cardiovascular:  Positive for chest pain.  Gastrointestinal: Negative.   Musculoskeletal:  Positive for arthralgias. Negative for joint swelling.  Neurological:  Negative for weakness and numbness.  All other systems reviewed and are  negative.   Physical Exam Updated Vital Signs BP 113/82   Pulse 61   Temp 99 F (37.2 C)   Resp 14   Ht 5\' 10"  (1.778 m)   Wt 99.8 kg   SpO2 98%   BMI 31.57 kg/m  Physical Exam Vitals and nursing note reviewed.  Constitutional:      Appearance: He is well-developed.  HENT:     Head: Normocephalic and atraumatic.  Eyes:     Conjunctiva/sclera: Conjunctivae normal.  Cardiovascular:     Rate and Rhythm: Normal rate and regular rhythm.     Pulses:          Radial pulses are 2+ on the right side and 2+ on the left side.     Heart sounds: Normal heart sounds.  Pulmonary:     Effort: Pulmonary effort is normal.     Breath sounds: Normal breath sounds. No wheezing or rhonchi.  Musculoskeletal:        General: Normal range of motion.     Left upper arm: Tenderness present.     Cervical back: Normal range of motion.     Comments: Ttp left upper chest wall with palpation, pain worsens with active and passive left shoulder ROM.  Small soft swelling left thenar eminence.  No bruising noted.  No forearm or upper arm pain or edema.  No rash or  erythema.    Skin:    General: Skin is warm and dry.  Neurological:     Mental Status: He is alert.     ED Results / Procedures / Treatments   Labs (all labs ordered are listed, but only abnormal results are displayed) Labs Reviewed  BASIC METABOLIC PANEL - Abnormal; Notable for the following components:      Result Value   BUN 24 (*)    All other components within normal limits  CBC  TROPONIN I (HIGH SENSITIVITY)  TROPONIN I (HIGH SENSITIVITY)    EKG EKG Interpretation Date/Time:  Wednesday November 13 2022 15:17:37 EDT Ventricular Rate:  60 PR Interval:  150 QRS Duration:  78 QT Interval:  384 QTC Calculation: 384 R Axis:   38  Text Interpretation: Normal sinus rhythm Confirmed by Gloris Manchester ([id number]) on 11/13/2022 4:11:30 PM  Radiology DG Hand Complete Left  Result Date: 11/13/2022 CLINICAL DATA:  Pain and swelling EXAM: LEFT  HAND - COMPLETE 3+ VIEW COMPARISON:  07/05/2003 FINDINGS: No recent fracture or dislocation is seen. Mild deformity in the shaft of fifth metacarpal may be residual from previous injury. This finding has not changed. There are small subcortical cysts in the scaphoid with no significant change. IMPRESSION: No acute findings are seen in left hand. Electronically Signed   By: Ernie Avena M.D.   On: 11/13/2022 16:48   DG Chest 2 View  Result Date: 11/13/2022 CLINICAL DATA:  Chest pain. EXAM: CHEST - 2 VIEW COMPARISON:  Aug 24, 2013. FINDINGS: The heart size and mediastinal contours are within normal limits. Both lungs are clear. The visualized skeletal structures are unremarkable. IMPRESSION: No active cardiopulmonary disease. Electronically Signed   By: Lupita Raider M.D.   On: 11/13/2022 15:59    Procedures Procedures    Medications Ordered in ED Medications  ketorolac (TORADOL) 30 MG/ML injection 15 mg (15 mg Intravenous Given 11/13/22 1930)    ED Course/ Medical Decision Making/ A&P                                 Medical Decision Making Pt with left chest and shoulder pain, reproducible, not associated with palpitation, sob, worse with movement, suggesting MSK source.  Discussed ice/heat tx,  ibuprofen, f/u pcp 1 week if not improving.  Doubt acs,  no sob, normal VS - PE unlikely.   Amount and/or Complexity of Data Reviewed Labs: ordered.    Details: Cbc, bmet normal , troponins negative.  Radiology: ordered.    Details: Chest negative,  left hand  negative for acute findings.   Risk Prescription drug management.           Final Clinical Impression(s) / ED Diagnoses Final diagnoses:  Left-sided chest wall pain    Rx / DC Orders ED Discharge Orders          Ordered    ibuprofen (ADVIL) 600 MG tablet  3 times daily        11/13/22 1929              Victoriano Lain 11/13/22 1931    Gloris Manchester, MD 11/14/22 [phone]

## 2022-11-13 NOTE — Discharge Instructions (Signed)
Take the entire course of the ibuprofen prescribed, with your normal blood work it is safe to take this medication and should help you with your shoulder pain.  I recommend alternating ice and heat, you can apply an ice pack to your area of pain for 20 to 30 minutes as frequently as is comfortable, I would also recommend adding a heating pad for 20 minutes at least twice daily.

## 2022-11-13 NOTE — ED Triage Notes (Signed)
Pt states lt arm pain starting today and states it radiates into his lt chest. Pt also states having rt shoulder pain. Pt does not appear to be in distress in triage.

## 2022-11-13 NOTE — ED Provider Triage Note (Signed)
Emergency Medicine Provider Triage Evaluation Note  Nathan Williamson , a 37 y.o. male  was evaluated in triage.  Pt complains of a 1 day history of left chest shoulder and upper back pain, worse with movement such as raising the arm.  He denies any obvious injuries.  Also denies palpitations, shortness of breath, nausea or vomiting, felt diaphoretic prior to arrival.  He was at work this morning also when he was pushing a heavy garbage bin up an incline when he developed pain in the left hand and noted a new tender swelling near his thumb.  Has found no alleviators..  Review of Systems  Positive: Chest pain, shoulder pain, arm pain Negative: Nausea, vomiting, shortness of breath, palpitations  Physical Exam  BP 122/89 (BP Location: Right Arm)   Pulse 69   Temp 99 F (37.2 C)   Ht 5\' 10"  (1.778 m)   Wt 99.8 kg   SpO2 96%   BMI 31.57 kg/m  Gen:   Awake, no distress   Resp:  Normal effort  MSK:   Moves extremities without difficulty  Other:  Tender to palpation anterior left shoulder, soft tissue swelling left hand thenar eminence.  Medical Decision Making  Medically screening exam initiated at 4:14 PM.  Appropriate orders placed.  Arvella Merles was informed that the remainder of the evaluation will be completed by another provider, this initial triage assessment does not replace that evaluation, and the importance of remaining in the ED until their evaluation is complete.     Burgess Amor, PA-C 11/13/22 1616

## 2022-12-12 ENCOUNTER — Encounter: Payer: Self-pay | Admitting: Endocrinology

## 2022-12-12 ENCOUNTER — Ambulatory Visit (INDEPENDENT_AMBULATORY_CARE_PROVIDER_SITE_OTHER): Payer: BC Managed Care – PPO | Admitting: Endocrinology

## 2022-12-12 VITALS — BP 115/85 | HR 64 | Ht 70.0 in | Wt 218.6 lb

## 2022-12-12 DIAGNOSIS — E039 Hypothyroidism, unspecified: Secondary | ICD-10-CM | POA: Diagnosis not present

## 2022-12-12 DIAGNOSIS — E031 Congenital hypothyroidism without goiter: Secondary | ICD-10-CM | POA: Diagnosis not present

## 2022-12-12 DIAGNOSIS — E049 Nontoxic goiter, unspecified: Secondary | ICD-10-CM | POA: Diagnosis not present

## 2022-12-12 LAB — TSH: TSH: 1.01 u[IU]/mL (ref 0.35–5.50)

## 2022-12-12 LAB — T4, FREE: Free T4: 0.93 ng/dL (ref 0.60–1.60)

## 2022-12-12 NOTE — Patient Instructions (Addendum)
Labs today.  US thyroid plan.   Things to know about Levothyroxine:   1.  The ideal time to take levothyroxine is on an empty stomach in the morning approximately thirty to sixty minutes before any food or milk products.  However, if it is easier to remember at bedtime, etc. that's okay as long as it's on an empty stomach ie after about four hours after big meal.    2.  Ideally, also, you shouldn't take other medications at the same time.  We do know that certain medications will interfere with the absorption of your Levothyroxine: Iron pills, calcium or milk products, multi vitamins, and stomach or acid medications.  Just take them a few hours 3 to 4 hours apart.  3.  Each brand seems to have variations compared to other brands, so a problem can be to switch brands (sometimes happens with getting a generic), because we base your dose on your blood tests.  No other problems with that generics and Brand name medication costs more.   4.  It is best to get in the habit of taking the levothyroxine daily.  However, this is a medication that you make up missed doses.  That is, if you forget to take it for a day or two, you take all of the missed doses as soon as you remember.   5.  There are no "side effects" to thyroid hormone - is identical to your body's hormone, only symptoms of over and under treatment.  [place] Courtney Bellizzi, MD [facility] Endocrinology [facility] [address] [place], [address] [place], [place] [address] Phone # [phone]

## 2022-12-12 NOTE — Progress Notes (Addendum)
Outpatient Endocrinology Note [place] Avamarie Crossley, MD  12/13/22  Patient's Name: Nathan Williamson    DOB: Oct 08, 1985    MRN: [id number]  REASON OF VISIT: New consult for hypothyroidism Coralyn Pear  REFERRING PROVIDER: Assunta Found, MD  PCP: Assunta Found, MD  HISTORY OF PRESENT ILLNESS:   Nathan Williamson is a 37 y.o. old male with past medical history as listed below is presented for evaluation of congenital hypothyroidism /goiter.   Pertinent Thyroid History: Patient was diagnosed with hypothyroidism at the age of 59-month, has congenital hypothyroidism.  He has goiter as well.  Patient has been on thyroid hormone replacement since diagnosis.  Dose has been adjusted in the past. Labs in May 2024 TSH 26 elevated, total T4 1.7 low, T3 uptake 17 low, free T4 index 0.3 low.  He has been taking levothyroxine 175 mcg daily.  He reports in May he did not have medical insurance and did not have medicine /levothyroxine was missing few days prior to thyroid lab at that time.  He has goiter in the anterior neck area, reports size has been slowly decreasing.  No neck discomfort or neck compressive symptoms.   Interval history 12/13/22 He has been taking levothyroxine 175 Mg daily.  She takes in the morning before breakfast at least 30 minutes.  He reports compliance. No fatigue, weight changes. No palpitations, excessive sweating, heat or cold intolerance.  No change in bowel habit.  No neck compressive symptoms.  No other complaints today.  REVIEW OF SYSTEMS:  As per history of present illness.   PAST MEDICAL HISTORY: Past Medical History:  Diagnosis Date   Embolism - blood clot    ITP (idiopathic thrombocytopenic purpura)    MRSA infection (methicillin-resistant Staphylococcus aureus) 2009   Thyroid disease     PAST SURGICAL HISTORY: History reviewed. No pertinent surgical history.  ALLERGIES: Allergies  Allergen Reactions   Penicillins Anaphylaxis    FAMILY HISTORY:  History reviewed. No  pertinent family history.  SOCIAL HISTORY: Social History   Socioeconomic History   Marital status: Legally Separated    Spouse name: Not on file   Number of children: Not on file   Years of education: Not on file   Highest education level: Not on file  Occupational History   Occupation: Unemployed  Tobacco Use   Smoking status: Former    Current packs/day: 0.00    Types: Cigarettes    Quit date: 2018    Years since quitting: 6.6   Smokeless tobacco: Never  Vaping Use   Vaping status: Never Used  Substance and Sexual Activity   Alcohol use: Not Currently    Comment: occ   Drug use: Yes    Frequency: 2.0 times per week    Types: Marijuana   Sexual activity: Not on file  Other Topics Concern   Not on file  Social History Narrative   Unemployed. Lives at home with mother.   States he cannot afford his medications.   Social Determinants of Health   Financial Resource Strain: Not on file  Food Insecurity: Not on file  Transportation Needs: Not on file  Physical Activity: Not on file  Stress: Not on file  Social Connections: Not on file    MEDICATIONS:  Current Outpatient Medications  Medication Sig Dispense Refill   levothyroxine (SYNTHROID) 175 MCG tablet Take 1 tablet (175 mcg total) by mouth daily. 90 tablet 4   No current facility-administered medications for this visit.    PHYSICAL EXAM: Vitals:  12/12/22 0830  BP: 115/85  Pulse: 64  SpO2: 97%  Weight: 218 lb 9.6 oz (99.2 kg)  Height: 5\' 10"  (1.778 m)   Body mass index is 31.37 kg/m.  Wt Readings from Last 3 Encounters:  12/12/22 218 lb 9.6 oz (99.2 kg)  11/13/22 220 lb (99.8 kg)  04/05/22 219 lb (99.3 kg)     General: Well developed, well nourished male in no apparent distress.  HEENT: AT/[place], no external lesions. Hearing intact to the spoken word Eyes: Conjunctiva clear and no icterus. Neck: Trachea midline, neck supple with appreciable thyromegaly / anterior centrally, ~ 2-3 cm, mobile and  non tender.   Lungs: Clear to auscultation, no wheeze. Respirations not labored Heart: S1S2, Regular in rate and rhythm. No loud murmurs Abdomen: Soft, non tender Neurologic: Alert, oriented, normal speech, deep tendon biceps reflexes normal,  no gross focal neurological deficit Extremities: No pedal pitting edema, no tremors of outstretched hands Skin: Warm, color good.  Psychiatric: Does not appear depressed or anxious  PERTINENT HISTORIC LABORATORY AND IMAGING STUDIES:  All pertinent laboratory results were reviewed. Please see HPI also for further details.   TSH  Date Value Ref Range Status  12/12/2022 1.01 0.35 - 5.50 uIU/mL Final  12/09/2010 4.032 0.350 - 4.500 uIU/mL Final     ASSESSMENT / PLAN  1. Primary hypothyroidism   2. Goiter   3. Congenital hypothyroidism    -Patient has congenital hypothyroidism with goiter.  Has been on thyroid hormone replacement since diagnosis.  He was diagnosed at the age of 36-month.  We discussed the medical need for compliance with levothyroxine therapy, that it is a hormone necessary for life, and that serious consequences may result from noncompliance. Discussed the proper method of levothyroxine administration: take on an empty stomach in the morning, with water, waiting thirty to sixty minutes before taking any other beverages or food. Also reviewed the need to take calcium or iron supplements or multivitamin (that may contain iron or calcium) at least 4 hours after levothyroxine administration.  -He is currently taking levothyroxine 175 mcg daily.  He had TSH 26 in May 2022 however he was missing levothyroxine at that time.  Plan: -Check thyroid function test TSH, free T4 and adjust the dose of levothyroxine. -Check ultrasound neck/thyroid to check on the goiter.  Diagnoses and all orders for this visit:  Primary hypothyroidism -     T4, free; Future -     TSH; Future -     TSH -     T4, free  Goiter -     US THYROID;  Future  Congenital hypothyroidism -     T4, free; Future -     TSH; Future -     TSH -     T4, free  Other orders -     levothyroxine (SYNTHROID) 175 MCG tablet; Take 1 tablet (175 mcg total) by mouth daily.  Normal thyroid function test, continue current dose of levothyroxine 175 mcg daily.   Latest Reference Range & Units 12/12/22 09:20  TSH 0.35 - 5.50 uIU/mL 1.01  T4,Free(Direct) 0.60 - 1.60 ng/dL 0.86    DISPOSITION Follow up in clinic in 3 months suggested.  All questions answered and patient verbalized understanding of the plan.  [place] Mica Ramdass, MD [facility] Endocrinology [facility] [address] [place], [address] [place], [place] [address] Phone # [phone]  At least part of this note was generated using voice recognition software. Inadvertent word errors may have occurred, which  were not recognized during the proofreading process.

## 2022-12-13 MED ORDER — LEVOTHYROXINE SODIUM 175 MCG PO TABS: 175.0000 ug | ORAL_TABLET | Freq: Every day | ORAL | 4 refills | Status: AC

## 2022-12-13 NOTE — Addendum Note (Signed)
Addended by: Earland Reish, [place] on: 12/13/2022 08:10 AM   Modules accepted: Orders

## 2023-01-16 ENCOUNTER — Other Ambulatory Visit: Payer: Self-pay

## 2023-01-16 ENCOUNTER — Encounter (HOSPITAL_COMMUNITY): Payer: Self-pay

## 2023-01-16 ENCOUNTER — Emergency Department (HOSPITAL_COMMUNITY)
Admission: EM | Admit: 2023-01-16 | Discharge: 2023-01-16 | Disposition: A | Discharge status: Home / Self Care | Payer: BC Managed Care – PPO | Attending: Emergency Medicine | Admitting: Emergency Medicine

## 2023-01-16 ENCOUNTER — Emergency Department (HOSPITAL_COMMUNITY): Payer: BC Managed Care – PPO

## 2023-01-16 DIAGNOSIS — M70811 Other soft tissue disorders related to use, overuse and pressure, right shoulder: Secondary | ICD-10-CM | POA: Diagnosis not present

## 2023-01-16 DIAGNOSIS — M19011 Primary osteoarthritis, right shoulder: Secondary | ICD-10-CM | POA: Insufficient documentation

## 2023-01-16 DIAGNOSIS — M25511 Pain in right shoulder: Secondary | ICD-10-CM | POA: Diagnosis not present

## 2023-01-16 MED ORDER — LIDOCAINE 5 % EX PTCH
1.0000 | MEDICATED_PATCH | CUTANEOUS | Status: DC
  Administered 2023-01-16: 1 via TRANSDERMAL
  Filled 2023-01-16: qty 1

## 2023-01-16 NOTE — ED Triage Notes (Signed)
C/o right shoulder pain x1 day with swelling per patient. Patient reports improvement with ice.  Pain increases with movement.

## 2023-01-16 NOTE — Discharge Instructions (Addendum)
It was a pleasure taking care of you today.  You were seen for arthritis in your right [facility] joint your shoulder.  You are being prescribed diclofenac gel.  He can also use over-the-counter Lidoderm patches in between using the diclofenac gel and continue using ice as needed.  Follow-up with orthopedics.  As we discussed take your arm out of the sling several times a day at least and do not need to sleep with the sling on.  Come back if you have swelling, redness, fevers or any other new or worsening symptoms.

## 2023-01-16 NOTE — ED Provider Notes (Signed)
[place] EMERGENCY DEPARTMENT AT [facility] Provider Note   CSN: [id number] Arrival date & time: 01/16/23  1135     History  Chief Complaint  Patient presents with   Shoulder Pain    Nathan Williamson is a 37 y.o. male.  He presents the ER for right shoulder pain worse the past day without any trauma.  States this pain comes and goes for many years.  States he had a remote injury but does not member what it is.  States usually he uses ice and heating pads.  States that he takes NSAIDs causes him to have an upset stomach so he avoids these medications.  He denies numbness tingling or weakness, no fevers or chills or swelling.  No recent trauma.   Shoulder Pain      Home Medications Prior to Admission medications   Medication Sig Start Date End Date Taking? Authorizing Provider  levothyroxine (SYNTHROID) 175 MCG tablet Take 1 tablet (175 mcg total) by mouth daily. 12/13/22   [place], [place], MD      Allergies    Penicillins    Review of Systems   Review of Systems  Physical Exam Updated Vital Signs BP 133/82   Pulse (!) 49   Temp 98.2 F (36.8 C) (Oral)   Resp 16   Ht 5\' 10"  (1.778 m)   Wt 103 kg   SpO2 99%   BMI 32.57 kg/m  Physical Exam Vitals and nursing note reviewed.  Constitutional:      General: He is not in acute distress.    Appearance: He is well-developed.  HENT:     Head: Normocephalic and atraumatic.  Eyes:     Conjunctiva/sclera: Conjunctivae normal.  Cardiovascular:     Rate and Rhythm: Normal rate and regular rhythm.     Heart sounds: No murmur heard. Pulmonary:     Effort: Pulmonary effort is normal. No respiratory distress.     Breath sounds: Normal breath sounds.  Abdominal:     Palpations: Abdomen is soft.     Tenderness: There is no abdominal tenderness.  Musculoskeletal:        General: No swelling.     Cervical back: Neck supple.     Comments: Wrist right AC joint.  No redness or swelling in this area.  Pain with raising  the right shoulder but range of motion intact despite pain.  Radial pulse on the right is intact, grip strength is normal.  Skin:    General: Skin is warm and dry.     Capillary Refill: Capillary refill takes less than 2 seconds.  Neurological:     Mental Status: He is alert.  Psychiatric:        Mood and Affect: Mood normal.     ED Results / Procedures / Treatments   Labs (all labs ordered are listed, but only abnormal results are displayed) Labs Reviewed - No data to display  EKG None  Radiology DG Shoulder Right  Result Date: 01/16/2023 CLINICAL DATA:  Right shoulder pain for 1 day.  Swelling. EXAM: RIGHT SHOULDER - 2+ VIEW COMPARISON:  Right clavicle radiographs 09/16/2010 FINDINGS: Mild to moderate acromioclavicular joint space narrowing, subchondral cystic change, and peripheral osteophytosis is worsened from remote 2012 radiographs. Normal alignment of the acromioclavicular and glenohumeral joints. No acute fracture or dislocation. The visualized portion of the right lung is unremarkable. IMPRESSION: Mild to moderate acromioclavicular osteoarthritis, worsened from remote 2012 radiographs. Electronically Signed   By: Kerin Salen.D.  On: 01/16/2023 14:28    Procedures Procedures    Medications Ordered in ED Medications  lidocaine (LIDODERM) 5 % 1 patch (has no administration in time range)    ED Course/ Medical Decision Making/ A&P                                 Medical Decision Making DDx: Fracture, dislocation, sprain, osteoarthritis, septic arthritis, other  ED course: Patient has atraumatic right shoulder pain worse for the past day but has been a chronic issue.  Right shoulder x-ray shows some AC arthritis no other acute findings, this is his area of pain.  He has no redness or swelling and no fevers or chills to suggest that this is a septic arthritis.  He has not had any recent trauma.  Will give sling to use as needed for comfort, advised on maintaining  range of motion, since he does not want to take p.o. meds will use topical diclofenac gel and advised on using over-the-counter Salonpas patches as needed as well and orthopedic follow-up.  Advised on strict return precautions.  Patient agreeable with plan of care and discharge.  Requesting work at this time which will be provided.  Amount and/or Complexity of Data Reviewed Radiology: ordered and independent interpretation performed.    Details: No fracture or dislocation, osteoarthritis right AC joint, agree with radiology read  Risk Prescription drug management.           Final Clinical Impression(s) / ED Diagnoses Final diagnoses:  Arthritis of right acromioclavicular joint    Rx / DC Orders ED Discharge Orders     None         Ma Rings, PA-C 01/16/23 1558    Vanetta Mulders, MD 01/21/23 [phone]

## 2023-01-20 ENCOUNTER — Telehealth: Payer: Self-pay | Admitting: Orthopedic Surgery

## 2023-01-20 NOTE — Telephone Encounter (Signed)
Returned the patient's call to schedule an appointment with Dr. Salena Saner.  Mailbox not setup.  He was seen in the ED 10/03 for his right shoulder.  New prob for this patient.

## 2023-01-24 ENCOUNTER — Encounter: Payer: Self-pay | Admitting: Orthopedic Surgery

## 2023-01-24 ENCOUNTER — Ambulatory Visit: Payer: BC Managed Care – PPO | Admitting: Orthopedic Surgery

## 2023-01-24 VITALS — BP 120/83 | HR 67 | Ht 70.0 in | Wt 223.0 lb

## 2023-01-24 DIAGNOSIS — M19011 Primary osteoarthritis, right shoulder: Secondary | ICD-10-CM | POA: Diagnosis not present

## 2023-01-24 NOTE — Progress Notes (Signed)
New Patient Visit  Assessment: Nathan Williamson is a 37 y.o. male with the following: 1. Arthritis of right acromioclavicular joint  Plan: Nathan Williamson has pain in the superior aspect of the right shoulder.  He also has some pain in the anterior aspect of the shoulder.  He does have pain with cross body adduction.  He has good strength with O'Brien's testing, with mild discomfort.  Most likely source of his pain is the [facility] joint, and radiographs demonstrate advanced degenerative changes.  We discussed proceeding with an injection, and this was completed in clinic today.  If he continues to have issues, he will return to clinic.  We did discuss that he may have some irritation of the biceps tendon in the anterior aspect of his shoulder.  He states understanding.  I would like see him back in about 6 weeks.  Procedure note injection - Right shoulder    Verbal consent was obtained to inject the right shoulder, AC joint Timeout was completed to confirm the site of injection.   The skin was prepped with alcohol and ethyl chloride was sprayed at the injection site.  A 21-gauge needle was used to inject 40 mg of Depo-Medrol and 1% lidocaine (1 cc) into the [facility] joint of the right shoulder using a superior approach.  There were no complications.  A sterile bandage was applied  Note: In order to accurately identify the placement of the needle, ultrasound was required, to increase the accuracy, and specificity of the injection.     Follow-up: Return in about 6 weeks (around 03/07/2023).  Subjective:  Chief Complaint  Patient presents with   Shoulder Pain    R shoulder pain with hx of multiple injuries, last big injury in '12. Told he has arthritis in his shoulder     History of Present Illness: Nathan Williamson is a 37 y.o. male who presents for evaluation of right shoulder pain.  He states he had acute onset of pain in the right shoulder.  He has had some pain in the right shoulder in the past.  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  He started to have pain, a little over a week ago.  He presented to the ED.  In the emergency department, radiographs demonstrated [facility] joint arthritis.  He does localize the pain to the superior aspect of the shoulder, in line with the [facility] joint.  He also has some pain in the anterior aspect the shoulder.  He states he cannot take NSAIDs, as it can interfere with his thyroid medications.  No radiating pains distally.  No numbness or tingling.   Review of Systems: No fevers or chills No numbness or tingling No chest pain No shortness of breath No bowel or bladder dysfunction No GI distress No headaches   Medical History:  Past Medical History:  Diagnosis Date   Embolism - blood clot    ITP (idiopathic thrombocytopenic purpura)    MRSA infection (methicillin-resistant Staphylococcus aureus) 2009   Thyroid disease     History reviewed. No pertinent surgical history.  History reviewed. No pertinent family history. Social History   Tobacco Use   Smoking status: Former    Current packs/day: 0.00    Types: Cigarettes    Quit date: 2018    Years since quitting: 6.7   Smokeless tobacco: Never  Vaping Use   Vaping status: Never Used  Substance Use Topics   Alcohol use: Not Currently  Comment: occ   Drug use: Yes    Frequency: 2.0 times per week    Types: Marijuana    Allergies  Allergen Reactions   Penicillins Anaphylaxis    Current Meds  Medication Sig   levothyroxine (SYNTHROID) 175 MCG tablet Take 1 tablet (175 mcg total) by mouth daily.    Objective: BP 120/83   Pulse 67   Ht 5\' 10"  (1.778 m)   Wt 223 lb (101.2 kg)   BMI 32.00 kg/m   Physical Exam:  General: Alert and oriented. and No acute distress. Gait: Normal gait.  Right shoulder with mild prominence over the [facility] joint.  He does have tenderness to palpation in this area.  Pain is recreated with cross body adduction.  Mild discomfort with O'Brien's testing,  but he has excellent strength.  No atrophy.  Fingers warm and well-perfused.  Sensation intact throughout the right hand.  IMAGING: I personally reviewed images previously obtained from the ED   X-rays from the emergency department demonstrate no acute injury.  There are advanced degenerative changes of the [facility] joint, including the decreased joint space and associated osteophytes.   New Medications:  No orders of the defined types were placed in this encounter.     Oliver Barre, MD  01/24/2023 9:11 AM

## 2023-01-24 NOTE — Patient Instructions (Signed)

## 2023-03-07 ENCOUNTER — Encounter: Payer: Self-pay | Admitting: Orthopedic Surgery

## 2023-03-07 ENCOUNTER — Ambulatory Visit: Payer: BC Managed Care – PPO | Admitting: Orthopedic Surgery

## 2023-03-07 VITALS — BP 134/88 | HR 57 | Ht 70.0 in | Wt 218.0 lb

## 2023-03-07 DIAGNOSIS — M19011 Primary osteoarthritis, right shoulder: Secondary | ICD-10-CM | POA: Diagnosis not present

## 2023-03-07 DIAGNOSIS — G8929 Other chronic pain: Secondary | ICD-10-CM

## 2023-03-07 MED ORDER — PREDNISONE 10 MG (21) PO TBPK: ORAL_TABLET | ORAL | 0 refills | Status: DC

## 2023-03-07 NOTE — Progress Notes (Signed)
Return patient Visit  Assessment: Nathan Williamson is a 37 y.o. male with the following: 1. Arthritis of right acromioclavicular joint  Plan: Arvella Merles continues to have pain in the superior aspect of the right shoulder.  At the last visit, the [facility] joint was injected.  This provided excellent relief, but only lasted for about a week.  He is having difficulty completing his tasks at work.  Radiographs demonstrates degenerative changes of the [facility] joint.  He is limited in his strength, as well as his motion.  He cannot take NSAIDs, so I provided him with a short course of prednisone.  At this point, I am recommending an MRI.  We briefly discussed the possibility of proceeding with right shoulder arthroscopy.  Pending the results of the MRI, we may have a more in-depth discussion.  He will return once the MRI is complete.     Follow-up: Return for After MRI.  Subjective:  Chief Complaint  Patient presents with   Shoulder Pain    R shoulder shot only helped for 3 days pain is back and getting worse.    History of Present Illness: Nathan Williamson is a 37 y.o. male who returns for evaluation of right shoulder pain.  He had recent onset of pain in the right shoulder.  Pain is localized to the [facility] joint area.  He has pain with certain motions, including cross body adduction.  Prior injection was effective, but the pain returned in less than a week.  He states that he has had 2 complete more tasks at work because somebody has been injured, and has not been coming to work.  Review of Systems: No fevers or chills No numbness or tingling No chest pain No shortness of breath No bowel or bladder dysfunction No GI distress No headaches  Objective: BP 134/88   Pulse (!) 57   Ht 5\' 10"  (1.778 m)   Wt 218 lb (98.9 kg)   BMI 31.28 kg/m   Physical Exam:  General: Alert and oriented. and No acute distress. Gait: Normal gait.  Right shoulder with prominence over the [facility] joint.  There is tenderness  to palpation in this area.  He has severe pain in this area with cross body adduction.  Also has pain in the anterior aspect the shoulder, with strength testing.  Positive O'Brien's.  Good range of motion.  No numbness or tingling.  No redness is appreciated.  Sensation intact throughout the right hand.  IMAGING: I personally reviewed images previously obtained from the ED    New Medications:  Meds ordered this encounter  Medications   predniSONE (STERAPRED UNI-PAK 21 TAB) 10 MG (21) TBPK tablet    Sig: 10 mg DS 12 as directed    Dispense:  48 tablet    Refill:  0      Oliver Barre, MD  03/07/2023 8:46 AM

## 2023-03-07 NOTE — Addendum Note (Signed)
Addended by: Baird Kay on: 03/07/2023 12:46 PM   Modules accepted: Orders

## 2023-03-18 ENCOUNTER — Ambulatory Visit: Payer: BC Managed Care – PPO | Admitting: Endocrinology

## 2023-03-30 ENCOUNTER — Ambulatory Visit
Admission: RE | Admit: 2023-03-30 | Discharge: 2023-03-30 | Disposition: A | Discharge status: Home / Self Care | Payer: BC Managed Care – PPO | Source: Ambulatory Visit | Attending: Orthopedic Surgery | Admitting: Orthopedic Surgery

## 2023-03-30 DIAGNOSIS — M19011 Primary osteoarthritis, right shoulder: Secondary | ICD-10-CM

## 2023-03-30 DIAGNOSIS — G8929 Other chronic pain: Secondary | ICD-10-CM

## 2023-03-30 DIAGNOSIS — M7581 Other shoulder lesions, right shoulder: Secondary | ICD-10-CM | POA: Diagnosis not present

## 2023-03-30 DIAGNOSIS — M25511 Pain in right shoulder: Secondary | ICD-10-CM | POA: Diagnosis not present

## 2023-04-02 ENCOUNTER — Encounter: Payer: Self-pay | Admitting: Orthopedic Surgery

## 2023-04-11 ENCOUNTER — Ambulatory Visit: Payer: Self-pay | Admitting: Orthopedic Surgery

## 2023-04-11 ENCOUNTER — Encounter: Payer: Self-pay | Admitting: Orthopedic Surgery

## 2023-04-11 ENCOUNTER — Ambulatory Visit: Payer: BC Managed Care – PPO | Admitting: Orthopedic Surgery

## 2023-04-11 VITALS — Ht 69.0 in | Wt 223.5 lb

## 2023-04-11 DIAGNOSIS — Z01818 Encounter for other preprocedural examination: Secondary | ICD-10-CM

## 2023-04-11 DIAGNOSIS — M19011 Primary osteoarthritis, right shoulder: Secondary | ICD-10-CM

## 2023-04-11 NOTE — Progress Notes (Signed)
Return patient Visit  Assessment: Nathan Williamson is a 37 y.o. male with the following: 1. Arthritis of right acromioclavicular joint  Plan: Nathan Williamson continues to have pain in the superior aspect of the right shoulder.  MRI demonstrates bony edema of both the distal clavicle, as well as the acromion.  There are associated cysts.  Prior injection did provide some relief of his symptoms.  He continues to have difficulty.  He does have some tendinosis of the rotator cuff tendons, with a very small insertional tear.  I do not think that the rotator cuff tendons need to be addressed.  However, I do think he will benefit from shoulder arthroscopy.  Arthroscopy to include distal clavicle excision.  This was discussed with the patient.  All questions have been answered.  He would like to proceed with surgery.  Risks and benefits of the surgery, including, but not limited to infection, bleeding, persistent pain, need for further surgery, blood clots and more severe complications associated with anesthesia were discussed with the patient.  The patient has elected to proceed.   Case request will be placed.  Date for surgery to be determined.  He will not need formal clearance.   Follow-up: Return for After surgery.  Subjective:  Chief Complaint  Patient presents with   Shoulder Pain    R/ still hurts. The cold weather doesn't help. I turned over on it last night and it really hurt, it caused me to sit straight up in the bed. The pain is a throbbing pain that stays right in the shoulder. I am here to go over my MRI.    History of Present Illness: Nathan Williamson is a 37 y.o. male who returns for evaluation of right shoulder pain.  He continues to have pain in the right shoulder.  Pain is primarily superior aspect the shoulder.  He does have some pain radiating distally.  He is currently out of work, as he is unable to complete his tasks.  Some difficulty with overhead motion.  He has completed an  MRI, and is here discuss the findings.  Review of Systems: No fevers or chills No numbness or tingling No chest pain No shortness of breath No bowel or bladder dysfunction No GI distress No headaches  Objective: Ht 5\' 9"  (1.753 m)   Wt 223 lb 8 oz (101.4 kg)   BMI 33.01 kg/m   Physical Exam:  General: Alert and oriented. and No acute distress. Gait: Normal gait.  Right shoulder with prominence over the [facility] joint.  There is tenderness to palpation in this area.  He has severe pain in this area with cross body adduction.  Also has pain in the anterior aspect the shoulder, with strength testing.  Positive O'Brien's.  Good range of motion.  No numbness or tingling.  No redness is appreciated.  Sensation intact throughout the right hand.  IMAGING: I personally reviewed images previously obtained in clinic  Right shoulder MRI  IMPRESSION: 1. Mild supraspinatus tendinosis with a tiny insertional interstitial tear. 2. Mild infraspinatus tendinosis with a small focal area of moderate insertional tendinosis. 3. Severe arthropathy of the acromioclavicular joint with bone marrow edema on either side of the joint.  New Medications:  No orders of the defined types were placed in this encounter.     Oliver Barre, MD  04/11/2023 11:29 AM

## 2023-04-22 NOTE — Patient Instructions (Signed)
 Nathan Williamson  04/22/2023     @PREFPERIOPPHARMACY @   Your procedure is scheduled on  04/28/2023.   Report to [facility] at  1020  A.M.   Call this number if you have problems the morning of surgery:  [phone]  If you experience any cold or flu symptoms such as cough, fever, chills, shortness of breath, etc. between now and your scheduled surgery, please notify us  at the above number.   Remember:  Do not eat after midnight.   You may drink clear liquids until 0820 am on 04/28/2023.    Clear liquids allowed are:                    Water, Juice (No red color; non-citric and without pulp; diabetics please choose diet or no sugar options), Carbonated beverages (diabetics please choose diet or no sugar options), Clear Tea (No creamer, milk, or cream, including half & half and powdered creamer), Black Coffee Only (No creamer, milk or cream, including half & half and powdered creamer), and Clear Sports drink (No red color; diabetics please choose diet or no sugar options)       At 0820 am on 04/28/2023 drink your carb drink. You can have nothing else to drink after this.    Take these medicines the morning of surgery with A SIP OF WATER                                        levothyroxine .     Do not wear jewelry, make-up or nail polish, including gel polish,  artificial nails, or any other type of covering on natural nails (fingers and  toes).  Do not wear lotions, powders, or perfumes, or deodorant.  Do not shave 48 hours prior to surgery.  Men may shave face and neck.  Do not bring valuables to the hospital.  [facility] is not responsible for any belongings or valuables.  Contacts, dentures or bridgework may not be worn into surgery.  Leave your suitcase in the car.  After surgery it may be brought to your room.  For patients admitted to the hospital, discharge time will be determined by your treatment team.  Patients discharged the day of surgery will not be allowed  to drive home and must have someone with them for 24 hours.    Special instructions:   DO NOT smoke tobacco or vape for 24 hours before your procedure.  Please read over the following fact sheets that you were given. Pain Booklet, Coughing and Deep Breathing, Surgical Site Infection Prevention, Anesthesia Post-op Instructions, and Care and Recovery After Surgery        Shoulder Arthroscopy, Care After The following information offers guidance on how to care for yourself after your procedure. Your health care provider may also give you more specific instructions. If you have problems or questions, contact your health care provider. What can I expect after the procedure? After the procedure, it is common to have: Pain. Swelling. A small amount of fluid from the incision. Stiffness that improves over time. Follow these instructions at home: If you have a sling or an immobilizer: Wear it as told by your health care provider. Remove it only as told by your health care provider. These devices protect your shoulder and help it heal by keeping it in place. Check the skin around  it every day. Tell your health care provider about any concerns. Loosen it if your fingers tingle, become numb, or turn cold and blue. Keep the sling or immobilizer clean. If it is not waterproof: Do not let it get wet. Cover it with a watertight covering when you take a bath or a shower. Incision care  Follow instructions from your health care provider about how to take care of your incisions. Make sure you: Wash your hands with soap and water for at least 20 seconds before and after you change your bandage (dressing). If soap and water are not available, use hand sanitizer. Change your dressing as told by your health care provider. Leave stitches (sutures), skin glue, or adhesive strips in place. These skin closures may need to stay in place for 2 weeks or longer. If adhesive strip edges start to loosen and curl  up, you may trim the loose edges. Do not remove adhesive strips completely unless your health care provider tells you to do that. Check your incision areas every day for signs of infection. Check for: Redness. More swelling or pain. Blood or more fluid. Warmth. Pus or a bad smell. Do not take baths, swim, or use a hot tub until your health care provider approves. Ask your health care provider if you may take showers. You may only be allowed to take sponge baths. Managing pain, stiffness, and swelling  If directed, put ice on the affected area. To do this: If you have a removable sling or immobilizer, remove it as told by your health care provider. Put ice in a plastic bag or use the icing device (cold therapy unit) that you were given. Follow instructions from your health care provider about how to use the icing device. Place a towel between your skin and the bag or between your skin and the icing device. Leave the ice on for 20 minutes, 2-3 times a day. Remove the ice if your skin turns bright red. This is very important. If you cannot feel pain, heat, or cold, you have a greater risk of damage to the area. Move your fingers often to reduce stiffness and swelling. Raise (elevate) the injured area above the level of your heart while you are lying down. It may help to sleep in a sitting position for a few days after your procedure. Try sleeping in a reclining chair or propping yourself up with extra pillows in bed. Activity Ask your health care provider what activities are safe for you during recovery. Do not lift with your affected shoulder until your health care provider approves. Avoid pulling and pushing with the arm on your affected side. If physical therapy was prescribed, do exercises as directed. Doing exercises may help to improve shoulder movement and flexibility (range of motion). Driving Ask your health care provider when it is safe to drive if you have a sling or  immobilizer. Ask your health care provider if the medicine prescribed to you requires you to avoid driving or using machinery. General instructions Take over-the-counter and prescription medicines only as told by your health care provider. Ask your health care provider if the medicine prescribed to you can cause constipation. You may need to take these actions to prevent or treat constipation: Drink enough fluid to keep your urine pale yellow. Take over-the-counter or prescription medicines. Eat foods that are high in fiber, such as beans, whole grains, and fresh fruits and vegetables. Limit foods that are high in fat and processed sugars, such as fried  or sweet foods. Do not use any products that contain nicotine or tobacco. These products include cigarettes, chewing tobacco, and vaping devices, such as e-cigarettes. These can delay incision healing after surgery. If you need help quitting, ask your health care provider. Keep all follow-up visits. This is important. Contact a health care provider if: You have a fever or chills. You have severe pain. You have any of these signs of infection: Redness around an incision. More swelling or pain in an incision area. Blood or more fluid coming from an incision. Warmth coming from an incision. Pus or a bad smell coming from an incision. You notice that an incision has opened up. You develop a rash. Get help right away if: You have difficulty breathing. You have chest pain. You notice that your fingers tingle, are numb, or are cold and blue even after you loosen your sling or immobilizer. You develop pain in your lower leg or at the back of your knee. These symptoms may represent a serious problem that is an emergency. Do not wait to see if the symptoms will go away. Get medical help right away. Call your local emergency services (911 in the [place] Do not drive yourself to the hospital. Summary If you have a sling or an immobilizer, wear it as  told by your health care provider. It may help to sleep in a sitting position for a few days after your procedure. If physical therapy was prescribed, do exercises as directed. Doing exercises may help to improve shoulder movement and flexibility (range of motion). Keep all follow-up visits. This is important. This information is not intended to replace advice given to you by your health care provider. Make sure you discuss any questions you have with your health care provider. Document Revised: 11/29/2019 Document Reviewed: 11/29/2019 Elsevier Patient Education  2024 [employer]. General Anesthesia, Adult, Care After The following information offers guidance on how to care for yourself after your procedure. Your health care provider may also give you more specific instructions. If you have problems or questions, contact your health care provider. What can I expect after the procedure? After the procedure, it is common for people to: Have pain or discomfort at the IV site. Have nausea or vomiting. Have a sore throat or hoarseness. Have trouble concentrating. Feel cold or chills. Feel weak, sleepy, or tired (fatigue). Have soreness and body aches. These can affect parts of the body that were not involved in surgery. Follow these instructions at home: For the time period you were told by your health care provider:  Rest. Do not participate in activities where you could fall or become injured. Do not drive or use machinery. Do not drink alcohol. Do not take sleeping pills or medicines that cause drowsiness. Do not make important decisions or sign legal documents. Do not take care of children on your own. General instructions Drink enough fluid to keep your urine pale yellow. If you have sleep apnea, surgery and certain medicines can increase your risk for breathing problems. Follow instructions from your health care provider about wearing your sleep device: Anytime you are sleeping,  including during daytime naps. While taking prescription pain medicines, sleeping medicines, or medicines that make you drowsy. Return to your normal activities as told by your health care provider. Ask your health care provider what activities are safe for you. Take over-the-counter and prescription medicines only as told by your health care provider. Do not use any products that contain nicotine or tobacco. These products  include cigarettes, chewing tobacco, and vaping devices, such as e-cigarettes. These can delay incision healing after surgery. If you need help quitting, ask your health care provider. Contact a health care provider if: You have nausea or vomiting that does not get better with medicine. You vomit every time you eat or drink. You have pain that does not get better with medicine. You cannot urinate or have bloody urine. You develop a skin rash. You have a fever. Get help right away if: You have trouble breathing. You have chest pain. You vomit blood. These symptoms may be an emergency. Get help right away. Call 911. Do not wait to see if the symptoms will go away. Do not drive yourself to the hospital. Summary After the procedure, it is common to have a sore throat, hoarseness, nausea, vomiting, or to feel weak, sleepy, or fatigue. For the time period you were told by your health care provider, do not drive or use machinery. Get help right away if you have difficulty breathing, have chest pain, or vomit blood. These symptoms may be an emergency. This information is not intended to replace advice given to you by your health care provider. Make sure you discuss any questions you have with your health care provider. Document Revised: 06/29/2021 Document Reviewed: 06/29/2021 Elsevier Patient Education  2024 [employer] Inc. How to Use Chlorhexidine  Before Surgery Chlorhexidine  gluconate (CHG) is a germ-killing (antiseptic) solution that is used to clean the skin. It can get rid  of the bacteria that normally live on the skin and can keep them away for about 24 hours. To clean your skin with CHG, you may be given: A CHG solution to use in the shower or as part of a sponge bath. A prepackaged cloth that contains CHG. Cleaning your skin with CHG may help lower the risk for infection: While you are staying in the intensive care unit of the hospital. If you have a vascular access, such as a central line, to provide short-term or long-term access to your veins. If you have a catheter to drain urine from your bladder. If you are on a ventilator. A ventilator is a machine that helps you breathe by moving air in and out of your lungs. After surgery. What are the risks? Risks of using CHG include: A skin reaction. Hearing loss, if CHG gets in your ears and you have a perforated eardrum. Eye injury, if CHG gets in your eyes and is not rinsed out. The CHG product catching fire. Make sure that you avoid smoking and flames after applying CHG to your skin. Do not use CHG: If you have a chlorhexidine  allergy or have previously reacted to chlorhexidine . On babies younger than 41 months of age. How to use CHG solution Use CHG only as told by your health care provider, and follow the instructions on the label. Use the full amount of CHG as directed. Usually, this is one bottle. During a shower Follow these steps when using CHG solution during a shower (unless your health care provider gives you different instructions): Start the shower. Use your normal soap and shampoo to wash your face and hair. Turn off the shower or move out of the shower stream. Pour the CHG onto a clean washcloth. Do not use any type of brush or rough-edged sponge. Starting at your neck, lather your body down to your toes. Make sure you follow these instructions: If you will be having surgery, pay special attention to the part of your body where you  will be having surgery. Scrub this area for at least 1  minute. Do not use CHG on your head or face. If the solution gets into your ears or eyes, rinse them well with water. Avoid your genital area. Avoid any areas of skin that have broken skin, cuts, or scrapes. Scrub your back and under your arms. Make sure to wash skin folds. Let the lather sit on your skin for 1-2 minutes or as long as told by your health care provider. Thoroughly rinse your entire body in the shower. Make sure that all body creases and crevices are rinsed well. Dry off with a clean towel. Do not put any substances on your body afterward--such as powder, lotion, or perfume--unless you are told to do so by your health care provider. Only use lotions that are recommended by the manufacturer. Put on clean clothes or pajamas. If it is the night before your surgery, sleep in clean sheets.  During a sponge bath Follow these steps when using CHG solution during a sponge bath (unless your health care provider gives you different instructions): Use your normal soap and shampoo to wash your face and hair. Pour the CHG onto a clean washcloth. Starting at your neck, lather your body down to your toes. Make sure you follow these instructions: If you will be having surgery, pay special attention to the part of your body where you will be having surgery. Scrub this area for at least 1 minute. Do not use CHG on your head or face. If the solution gets into your ears or eyes, rinse them well with water. Avoid your genital area. Avoid any areas of skin that have broken skin, cuts, or scrapes. Scrub your back and under your arms. Make sure to wash skin folds. Let the lather sit on your skin for 1-2 minutes or as long as told by your health care provider. Using a different clean, wet washcloth, thoroughly rinse your entire body. Make sure that all body creases and crevices are rinsed well. Dry off with a clean towel. Do not put any substances on your body afterward--such as powder, lotion, or  perfume--unless you are told to do so by your health care provider. Only use lotions that are recommended by the manufacturer. Put on clean clothes or pajamas. If it is the night before your surgery, sleep in clean sheets. How to use CHG prepackaged cloths Only use CHG cloths as told by your health care provider, and follow the instructions on the label. Use the CHG cloth on clean, dry skin. Do not use the CHG cloth on your head or face unless your health care provider tells you to. When washing with the CHG cloth: Avoid your genital area. Avoid any areas of skin that have broken skin, cuts, or scrapes. Before surgery Follow these steps when using a CHG cloth to clean before surgery (unless your health care provider gives you different instructions): Using the CHG cloth, vigorously scrub the part of your body where you will be having surgery. Scrub using a back-and-forth motion for 3 minutes. The area on your body should be completely wet with CHG when you are done scrubbing. Do not rinse. Discard the cloth and let the area air-dry. Do not put any substances on the area afterward, such as powder, lotion, or perfume. Put on clean clothes or pajamas. If it is the night before your surgery, sleep in clean sheets.  For general bathing Follow these steps when using CHG cloths for general bathing (  unless your health care provider gives you different instructions). Use a separate CHG cloth for each area of your body. Make sure you wash between any folds of skin and between your fingers and toes. Wash your body in the following order, switching to a new cloth after each step: The front of your neck, shoulders, and chest. Both of your arms, under your arms, and your hands. Your stomach and groin area, avoiding the genitals. Your right leg and foot. Your left leg and foot. The back of your neck, your back, and your buttocks. Do not rinse. Discard the cloth and let the area air-dry. Do not put any  substances on your body afterward--such as powder, lotion, or perfume--unless you are told to do so by your health care provider. Only use lotions that are recommended by the manufacturer. Put on clean clothes or pajamas. Contact a health care provider if: Your skin gets irritated after scrubbing. You have questions about using your solution or cloth. You swallow any chlorhexidine . Call your local poison control center [phone])101-5135 in the [place] Get help right away if: Your eyes itch badly, or they become very red or swollen. Your skin itches badly and is red or swollen. Your hearing changes. You have trouble seeing. You have swelling or tingling in your mouth or throat. You have trouble breathing. These symptoms may represent a serious problem that is an emergency. Do not wait to see if the symptoms will go away. Get medical help right away. Call your local emergency services (911 in the [place] Do not drive yourself to the hospital. Summary Chlorhexidine  gluconate (CHG) is a germ-killing (antiseptic) solution that is used to clean the skin. Cleaning your skin with CHG may help to lower your risk for infection. You may be given CHG to use for bathing. It may be in a bottle or in a prepackaged cloth to use on your skin. Carefully follow your health care provider's instructions and the instructions on the product label. Do not use CHG if you have a chlorhexidine  allergy. Contact your health care provider if your skin gets irritated after scrubbing. This information is not intended to replace advice given to you by your health care provider. Make sure you discuss any questions you have with your health care provider. Document Revised: 07/30/2021 Document Reviewed: 06/12/2020 Elsevier Patient Education  2023 [employer].

## 2023-04-24 ENCOUNTER — Encounter (HOSPITAL_COMMUNITY): Payer: Self-pay

## 2023-04-24 ENCOUNTER — Encounter (HOSPITAL_COMMUNITY)
Admission: RE | Admit: 2023-04-24 | Discharge: 2023-04-24 | Disposition: A | Discharge status: Home / Self Care | Payer: BC Managed Care – PPO | Source: Ambulatory Visit | Attending: Orthopedic Surgery | Admitting: Orthopedic Surgery

## 2023-04-24 DIAGNOSIS — Z01812 Encounter for preprocedural laboratory examination: Secondary | ICD-10-CM | POA: Diagnosis not present

## 2023-04-24 DIAGNOSIS — Z01818 Encounter for other preprocedural examination: Secondary | ICD-10-CM

## 2023-04-24 LAB — CBC
HCT: 43.9 % (ref 39.0–52.0)
Hemoglobin: 14.2 g/dL (ref 13.0–17.0)
MCH: 26.9 pg (ref 26.0–34.0)
MCHC: 32.3 g/dL (ref 30.0–36.0)
MCV: 83.1 fL (ref 80.0–100.0)
Platelets: 222 10*3/uL (ref 150–400)
RBC: 5.28 MIL/uL (ref 4.22–5.81)
RDW: 13 % (ref 11.5–15.5)
WBC: 7.4 10*3/uL (ref 4.0–10.5)
nRBC: 0 % (ref 0.0–0.2)

## 2023-04-24 LAB — BASIC METABOLIC PANEL
Anion gap: 11 (ref 5–15)
BUN: 17 mg/dL (ref 6–20)
CO2: 22 mmol/L (ref 22–32)
Calcium: 9.2 mg/dL (ref 8.9–10.3)
Chloride: 102 mmol/L (ref 98–111)
Creatinine, Ser: 1.16 mg/dL (ref 0.61–1.24)
GFR, Estimated: >60 mL/min (ref >60)
Glucose, Bld: 93 mg/dL (ref 70–99)
Potassium: 4 mmol/L (ref 3.5–5.1)
Sodium: 135 mmol/L (ref 135–145)

## 2023-04-28 ENCOUNTER — Ambulatory Visit (HOSPITAL_COMMUNITY): Payer: BC Managed Care – PPO

## 2023-04-28 ENCOUNTER — Ambulatory Visit (HOSPITAL_COMMUNITY): Payer: Self-pay | Admitting: Certified Registered"

## 2023-04-28 ENCOUNTER — Encounter (HOSPITAL_COMMUNITY): Payer: Self-pay | Admitting: Orthopedic Surgery

## 2023-04-28 ENCOUNTER — Encounter: Payer: Self-pay | Admitting: Orthopedic Surgery

## 2023-04-28 ENCOUNTER — Encounter (HOSPITAL_COMMUNITY)
Admission: RE | Disposition: A | Discharge status: Home / Self Care | Payer: Self-pay | Source: Home / Self Care | Attending: Orthopedic Surgery

## 2023-04-28 ENCOUNTER — Ambulatory Visit (HOSPITAL_COMMUNITY)
Admission: RE | Admit: 2023-04-28 | Discharge: 2023-04-28 | Disposition: A | Discharge status: Home / Self Care | Payer: BC Managed Care – PPO | Attending: Orthopedic Surgery | Admitting: Orthopedic Surgery

## 2023-04-28 DIAGNOSIS — M19011 Primary osteoarthritis, right shoulder: Secondary | ICD-10-CM | POA: Insufficient documentation

## 2023-04-28 DIAGNOSIS — Z87891 Personal history of nicotine dependence: Secondary | ICD-10-CM | POA: Insufficient documentation

## 2023-04-28 DIAGNOSIS — Z9889 Other specified postprocedural states: Secondary | ICD-10-CM | POA: Diagnosis not present

## 2023-04-28 DIAGNOSIS — Z86718 Personal history of other venous thrombosis and embolism: Secondary | ICD-10-CM | POA: Insufficient documentation

## 2023-04-28 HISTORY — PX: SHOULDER ARTHROSCOPY WITH DISTAL CLAVICLE RESECTION: SHX5675

## 2023-04-28 SURGERY — SHOULDER ARTHROSCOPY WITH DISTAL CLAVICLE RESECTION
Anesthesia: General | Site: Shoulder | Laterality: Right

## 2023-04-28 MED ORDER — MIDAZOLAM HCL 2 MG/2ML IJ SOLN: 2.0000 mg | Freq: Once | INTRAMUSCULAR | Status: AC

## 2023-04-28 MED ORDER — ASPIRIN 81 MG PO TBEC: 81.0000 mg | DELAYED_RELEASE_TABLET | Freq: Two times a day (BID) | ORAL | 0 refills | Status: AC

## 2023-04-28 MED ORDER — PROPOFOL 10 MG/ML IV BOLUS
INTRAVENOUS | Status: AC
  Filled 2023-04-28: qty 20

## 2023-04-28 MED ORDER — SCOPOLAMINE 1 MG/3DAYS TD PT72
MEDICATED_PATCH | TRANSDERMAL | Status: AC
  Filled 2023-04-28: qty 1

## 2023-04-28 MED ORDER — PROPOFOL 10 MG/ML IV BOLUS
INTRAVENOUS | Status: DC | PRN
Start: 2023-04-28 — End: 2023-04-28
  Administered 2023-04-28: 200 mg via INTRAVENOUS

## 2023-04-28 MED ORDER — MIDAZOLAM HCL 2 MG/2ML IJ SOLN
INTRAMUSCULAR | Status: AC
  Administered 2023-04-28: 2 mg via INTRAVENOUS
  Filled 2023-04-28: qty 2

## 2023-04-28 MED ORDER — DEXAMETHASONE SODIUM PHOSPHATE 10 MG/ML IJ SOLN
INTRAMUSCULAR | Status: DC | PRN
  Administered 2023-04-28: 10 mg via INTRAVENOUS

## 2023-04-28 MED ORDER — ACETAMINOPHEN 500 MG PO TABS
1000.0000 mg | ORAL_TABLET | Freq: Once | ORAL | Status: AC
  Administered 2023-04-28: 1000 mg via ORAL

## 2023-04-28 MED ORDER — FENTANYL CITRATE (PF) 100 MCG/2ML IJ SOLN
INTRAMUSCULAR | Status: AC
  Filled 2023-04-28: qty 2

## 2023-04-28 MED ORDER — ROCURONIUM BROMIDE 10 MG/ML (PF) SYRINGE
PREFILLED_SYRINGE | INTRAVENOUS | Status: DC | PRN
  Administered 2023-04-28: 60 mg via INTRAVENOUS

## 2023-04-28 MED ORDER — SODIUM CHLORIDE 0.9 % IR SOLN
Status: DC | PRN
  Administered 2023-04-28 (×4): 3000 mL

## 2023-04-28 MED ORDER — ACETAMINOPHEN 500 MG PO TABS: 1000.0000 mg | ORAL_TABLET | Freq: Three times a day (TID) | ORAL | 0 refills | Status: AC

## 2023-04-28 MED ORDER — CHLORHEXIDINE GLUCONATE 0.12 % MT SOLN
OROMUCOSAL | Status: AC
  Filled 2023-04-28: qty 15

## 2023-04-28 MED ORDER — ONDANSETRON HCL 4 MG/2ML IJ SOLN
INTRAMUSCULAR | Status: DC | PRN
  Administered 2023-04-28: 4 mg via INTRAVENOUS

## 2023-04-28 MED ORDER — ONDANSETRON HCL 4 MG PO TABS: 4.0000 mg | ORAL_TABLET | Freq: Three times a day (TID) | ORAL | 0 refills | Status: AC | PRN

## 2023-04-28 MED ORDER — ROCURONIUM BROMIDE 10 MG/ML (PF) SYRINGE
PREFILLED_SYRINGE | INTRAVENOUS | Status: AC
  Filled 2023-04-28: qty 10

## 2023-04-28 MED ORDER — ONDANSETRON HCL 4 MG/2ML IJ SOLN
INTRAMUSCULAR | Status: AC
  Filled 2023-04-28: qty 2

## 2023-04-28 MED ORDER — ACETAMINOPHEN 500 MG PO TABS
ORAL_TABLET | ORAL | Status: AC
  Filled 2023-04-28: qty 2

## 2023-04-28 MED ORDER — ROPIVACAINE HCL 5 MG/ML IJ SOLN
INTRAMUSCULAR | Status: AC
  Filled 2023-04-28: qty 30

## 2023-04-28 MED ORDER — OXYCODONE HCL 5 MG/5ML PO SOLN: 5.0000 mg | Freq: Once | ORAL | Status: DC | PRN

## 2023-04-28 MED ORDER — SCOPOLAMINE 1 MG/3DAYS TD PT72
MEDICATED_PATCH | TRANSDERMAL | Status: DC | PRN
  Administered 2023-04-28: 1 via TRANSDERMAL

## 2023-04-28 MED ORDER — ORAL CARE MOUTH RINSE: 15.0000 mL | Freq: Once | OROMUCOSAL | Status: AC

## 2023-04-28 MED ORDER — ONDANSETRON HCL 4 MG/2ML IJ SOLN: 4.0000 mg | Freq: Once | INTRAMUSCULAR | Status: DC | PRN

## 2023-04-28 MED ORDER — PHENYLEPHRINE 80 MCG/ML (10ML) SYRINGE FOR IV PUSH (FOR BLOOD PRESSURE SUPPORT)
PREFILLED_SYRINGE | INTRAVENOUS | Status: DC | PRN
  Administered 2023-04-28: 160 ug via INTRAVENOUS

## 2023-04-28 MED ORDER — LACTATED RINGERS IV SOLN: INTRAVENOUS | Status: DC | PRN

## 2023-04-28 MED ORDER — HYDROMORPHONE HCL 1 MG/ML IJ SOLN: 0.2500 mg | INTRAMUSCULAR | Status: DC | PRN

## 2023-04-28 MED ORDER — EPINEPHRINE PF 1 MG/ML IJ SOLN
INTRAMUSCULAR | Status: AC
  Filled 2023-04-28: qty 8

## 2023-04-28 MED ORDER — BUPIVACAINE-EPINEPHRINE (PF) 0.5% -1:200000 IJ SOLN
INTRAMUSCULAR | Status: AC
  Filled 2023-04-28: qty 30

## 2023-04-28 MED ORDER — DEXMEDETOMIDINE HCL IN NACL 80 MCG/20ML IV SOLN
INTRAVENOUS | Status: DC | PRN
  Administered 2023-04-28 (×3): 8 ug via INTRAVENOUS

## 2023-04-28 MED ORDER — SUGAMMADEX SODIUM 200 MG/2ML IV SOLN
INTRAVENOUS | Status: DC | PRN
  Administered 2023-04-28: 200 mg via INTRAVENOUS

## 2023-04-28 MED ORDER — CHLORHEXIDINE GLUCONATE 0.12 % MT SOLN
15.0000 mL | Freq: Once | OROMUCOSAL | Status: AC
  Administered 2023-04-28: 15 mL via OROMUCOSAL

## 2023-04-28 MED ORDER — LIDOCAINE HCL (PF) 2 % IJ SOLN
INTRAMUSCULAR | Status: DC | PRN
  Administered 2023-04-28: 100 mg via INTRADERMAL

## 2023-04-28 MED ORDER — OXYCODONE HCL 5 MG PO TABS: 5.0000 mg | ORAL_TABLET | Freq: Once | ORAL | Status: DC | PRN

## 2023-04-28 MED ORDER — CLINDAMYCIN PHOSPHATE 900 MG/50ML IV SOLN
900.0000 mg | INTRAVENOUS | Status: AC
  Administered 2023-04-28: 900 mg via INTRAVENOUS
  Filled 2023-04-28: qty 50

## 2023-04-28 MED ORDER — LIDOCAINE HCL (PF) 2 % IJ SOLN
INTRAMUSCULAR | Status: AC
  Filled 2023-04-28: qty 5

## 2023-04-28 MED ORDER — OXYCODONE HCL 5 MG PO TABS: 5.0000 mg | ORAL_TABLET | ORAL | 0 refills | Status: AC | PRN

## 2023-04-28 MED ORDER — MIDAZOLAM HCL 2 MG/2ML IJ SOLN
INTRAMUSCULAR | Status: AC
  Filled 2023-04-28: qty 2

## 2023-04-28 MED ORDER — CELECOXIB 100 MG PO CAPS: 100.0000 mg | ORAL_CAPSULE | Freq: Every day | ORAL | 0 refills | Status: AC

## 2023-04-28 MED ORDER — PHENYLEPHRINE HCL-NACL 20-0.9 MG/250ML-% IV SOLN
INTRAVENOUS | Status: AC
  Filled 2023-04-28: qty 250

## 2023-04-28 MED ORDER — DEXAMETHASONE SODIUM PHOSPHATE 10 MG/ML IJ SOLN
INTRAMUSCULAR | Status: AC
  Filled 2023-04-28: qty 1

## 2023-04-28 MED ORDER — FENTANYL CITRATE (PF) 100 MCG/2ML IJ SOLN
INTRAMUSCULAR | Status: DC | PRN
  Administered 2023-04-28 (×2): 50 ug via INTRAVENOUS

## 2023-04-28 SURGICAL SUPPLY — 42 items
BLADE SURG SZ11 CARB STEEL (BLADE) IMPLANT
BURR OVAL 8 FLU 5.0X13 (MISCELLANEOUS) IMPLANT
CHLORAPREP W/TINT 26 (MISCELLANEOUS) ×2 IMPLANT
CLOTH BEACON ORANGE TIMEOUT ST (SAFETY) ×2 IMPLANT
COOLER ICEMAN CLASSIC (MISCELLANEOUS) ×2 IMPLANT
COUNTER NDL MAGNETIC 40 RED (SET/KITS/TRAYS/PACK) ×2 IMPLANT
COUNTER NEEDLE MAGNETIC 40 RED (SET/KITS/TRAYS/PACK) ×1
COVER LIGHT HANDLE STERIS (MISCELLANEOUS) ×4 IMPLANT
CUTTER BONE 4.0MM X 13CM (MISCELLANEOUS) IMPLANT
DRAPE SHOULDER BEACH CHAIR (DRAPES) ×2 IMPLANT
DRAPE U-SHAPE 47X51 STRL (DRAPES) ×2 IMPLANT
GAUZE SPONGE 4X4 12PLY STRL (GAUZE/BANDAGES/DRESSINGS) IMPLANT
GAUZE XEROFORM 1X8 LF (GAUZE/BANDAGES/DRESSINGS) IMPLANT
GLOVE BIO SURGEON STRL SZ7 (GLOVE) IMPLANT
GLOVE BIO SURGEON STRL SZ8 (GLOVE) ×6 IMPLANT
GLOVE BIOGEL PI IND STRL 7.0 (GLOVE) ×4 IMPLANT
GLOVE BIOGEL PI IND STRL 8 (GLOVE) ×2 IMPLANT
GLOVE ECLIPSE 8.5 STRL (GLOVE) IMPLANT
GOWN STRL REUS W/TWL LRG LVL3 (GOWN DISPOSABLE) ×4 IMPLANT
GOWN STRL REUS W/TWL XL LVL3 (GOWN DISPOSABLE) ×2 IMPLANT
IV NS IRRIG 3000ML ARTHROMATIC (IV SOLUTION) ×4 IMPLANT
KIT POSITION SHOULDER SCHLEI (MISCELLANEOUS) ×2 IMPLANT
KIT TURNOVER KIT A (KITS) ×2 IMPLANT
MANIFOLD NEPTUNE II (INSTRUMENTS) ×2 IMPLANT
MARKER SKIN DUAL TIP RULER LAB (MISCELLANEOUS) ×2 IMPLANT
NDL SPNL 18GX3.5 QUINCKE PK (NEEDLE) ×2 IMPLANT
NEEDLE SPNL 18GX3.5 QUINCKE PK (NEEDLE) ×1
PACK TOTAL JOINT (CUSTOM PROCEDURE TRAY) ×2 IMPLANT
PAD ABD 5X9 TENDERSORB (GAUZE/BANDAGES/DRESSINGS) IMPLANT
PAD ARMBOARD 7.5X6 YLW CONV (MISCELLANEOUS) ×2 IMPLANT
PAD COLD SHLDR WRAP-ON (PAD) ×2 IMPLANT
SET ARTHROSCOPY INST (INSTRUMENTS) ×2 IMPLANT
SET BASIN LINEN APH (SET/KITS/TRAYS/PACK) ×2 IMPLANT
SLING ARM FOAM STRAP LRG (SOFTGOODS) IMPLANT
SPONGE T-LAP 18X18 ~~LOC~~+RFID (SPONGE) ×2 IMPLANT
SUT ETHILON 3 0 FSL (SUTURE) IMPLANT
SYR 30ML LL (SYRINGE) ×2 IMPLANT
SYR BULB IRRIG 60ML STRL (SYRINGE) ×4 IMPLANT
TOWEL OR 17X26 4PK STRL BLUE (TOWEL DISPOSABLE) ×2 IMPLANT
TUBING IN/OUT FLOW W/MAIN PUMP (TUBING) ×2 IMPLANT
WAND 90 DEG TURBOVAC W/CORD (SURGICAL WAND) ×2 IMPLANT
YANKAUER SUCT BULB TIP 10FT TU (MISCELLANEOUS) ×4 IMPLANT

## 2023-04-28 NOTE — Interval H&P Note (Signed)
 History and Physical Interval Note:  04/28/2023 10:39 AM  Nathan Williamson  has presented today for surgery, with the diagnosis of Right shoulder AC joint arthritis.  The various methods of treatment have been discussed with the patient and family. After consideration of risks, benefits and other options for treatment, the patient has consented to  Procedure(s): SHOULDER ARTHROSCOPY WITH DISTAL CLAVICLE RESECTION (Right) as a surgical intervention.  The patient's history has been reviewed, patient examined, no change in status, stable for surgery.  I have reviewed the patient's chart and labs.  Questions were answered to the patient's satisfaction.     [name]

## 2023-04-28 NOTE — Transfer of Care (Signed)
 Immediate Anesthesia Transfer of Care Note  Patient: Nathan Williamson  Procedure(s) Performed: SHOULDER ARTHROSCOPY WITH DISTAL CLAVICLE RESECTION (Right: Shoulder)  Patient Location: PACU  Anesthesia Type:General  Level of Consciousness: drowsy  Airway & Oxygen Therapy: Patient Spontanous Breathing and Patient connected to face mask oxygen  Post-op Assessment: Report given to RN and Post -op Vital signs reviewed and stable  Post vital signs: Reviewed and stable  Last Vitals:  Vitals Value Taken Time  BP 108/71 04/28/23 1245  Temp    Pulse 75 04/28/23 1246  Resp 20 04/28/23 1246  SpO2 100 % 04/28/23 1246  Vitals shown include unfiled device data.  Last Pain:  Vitals:   04/28/23 0957  TempSrc: Oral  PainSc: 9       Patients Stated Pain Goal: 6 (04/28/23 0957)  Complications: No notable events documented.

## 2023-04-28 NOTE — Anesthesia Procedure Notes (Signed)
 Procedure Name: Intubation Date/Time: 04/28/2023 11:05 AM  Performed by: Eliodoro Deward FALCON, CRNAPre-anesthesia Checklist: Patient identified, Emergency Drugs available, Suction available and Patient being monitored Patient Re-evaluated:Patient Re-evaluated prior to induction Oxygen Delivery Method: Circle system utilized Preoxygenation: Pre-oxygenation with 100% oxygen Induction Type: IV induction Ventilation: Oral airway inserted - appropriate to patient size and Mask ventilation without difficulty Laryngoscope Size: Mac and 4 Grade View: Grade II Tube type: Oral Tube size: 7.5 mm Number of attempts: 1 Airway Equipment and Method: Stylet and Oral airway Placement Confirmation: ETT inserted through vocal cords under direct vision, positive ETCO2 and breath sounds checked- equal and bilateral Secured at: 23 cm Tube secured with: Tape Dental Injury: Teeth and Oropharynx as per pre-operative assessment

## 2023-04-28 NOTE — Anesthesia Procedure Notes (Addendum)
 Anesthesia Regional Block: Cervical plexus block   Pre-Anesthetic Checklist: , timeout performed,  Correct Patient, Correct Site, Correct Laterality,  Correct Procedure, Correct Position, site marked,  Risks and benefits discussed,  Surgical consent,  Pre-op evaluation,  At surgeon's request and post-op pain management  Laterality: Right  Prep: chloraprep       Needles:  Injection technique: Single-shot  Needle Type: Echogenic Needle     Needle Length: 4cm  Needle Gauge: 20   Needle insertion depth: 2 cm   Additional Needles:   Procedures:,,,, ultrasound used (permanent image in chart),,    Narrative:  Start time: 04/28/2023 10:32 AM End time: 04/28/2023 10:35 AM Injection made incrementally with aspirations every 5 mL.  Performed by: Personally  Anesthesiologist: Landry Dunnings, MD  Additional Notes: 10 cc 0.5% Ropivicaine injected

## 2023-04-28 NOTE — Op Note (Signed)
 Orthopaedic Surgery Operative Note (CSN: [id number])  Nathan Williamson  [birth year] Date of Surgery: 04/28/2023   Diagnoses:  Right shoulder Acromioclavicular joint arthritis  Procedure: Arthroscopic subacromial decompression Arthroscopic distal clavicle excision   Operative Finding Exam under anesthesia: 170 degrees of forward flexion.  140 degrees of abduction.  50 degrees of external rotation at his side. Articular space: No loose bodies, capsule intact, labrum intact Chondral surfaces:Intact, no sign of chondral degeneration on the glenoid or humeral head Biceps: Intact and within the groove.  No obvious irritation Subscapularis: Tendon was intact Superior Cuff: Intact Bursal side: Mild fraying  Successful completion of the planned procedure.  Right shoulder diagnostic arthroscopy.  Subacromial decompression with arthroscopic distal clavicle excision.     Post-Op Diagnosis: Same Surgeons:Primary: Onesimo Oneil LABOR, MD Assistants: Levon Free Location: [facility] OR ROOM 4 Anesthesia: General with interscalene block Antibiotics: Clindamycin  Tourniquet time: None Estimated Blood Loss: Minimal Complications: None Specimens: None Implants: None  Indications for Surgery:   Nathan Williamson is a 38 y.o. male with continued shoulder pain refractory to nonoperative measures for extended period of time.  Radiographs demonstrated AC joint arthritis, including osteophytes and narrowing of the joint.  He had an injection, with some improvement in his symptoms.  However, the pain returned.  MRI demonstrated edema at the [facility] joint, specifically within the distal clavicle.  As a result, he was indicated for distal clavicle excision.  We plan to do this arthroscopically.  Procedure was discussed in detail.  The risks and benefits were explained at length, including but not limited to continued pain, stiffness, need for further surgery, blood clots, infection and more severe complications associated with  anesthesia.  He elected to proceed.  All questions were answered.   Procedure:   Patient was correctly identified in the preoperative holding area and operative site marked.  Patient brought to OR and positioned beachchair ensuring that all bony prominences were padded and the head was in an appropriate location.  Anesthesia was induced and the operative shoulder was prepped and draped in the usual sterile fashion.  Timeout was called preincision.  A standard posterior viewing portal was made after localizing the portal with a spinal needle.  An anterior accessory portal was also made.  The glenohumeral joint was evaluated.  Biceps tendon was intact, without evidence of irritation.  It was within the groove.  Subscapularis tendon was intact, with good tension.  Superior labrum was without an injury.  Superior cuff was intact from within the joint.  The camera was then inserted within the subacromial space.  There was a fair amount of bursitis which needed to be debrided.  This was done with combination of shaver and electrocautery.  The undersurface of the acromion was skeletonized using electrocautery.  It was followed medially to the [facility] joint.  We were able to identify the [facility] joint, by palpation of the clavicle.  We inserted a spinal needle within the [facility] joint to confirm our location.  We then used a bur to gently debride the most medial portion of the acromion, in order to improve visibility.  We were able to clearly visualize the distal extent of the clavicle.  The soft tissue of the [facility] joint was debrided using electrocautery.  We then used the bur to remove approximately 8 mm of bone between the distal clavicle, as well as the acromion.  We are able to visualize the superior aspect of the [facility] joint, to confirm that the bone had been fully removed.  Once again, use electrocautery to remove soft tissue from the [facility] joint.  The distal clavicle was fully visualized.  Once were satisfied that sufficient bony  been removed, the bur was removed.  Nothing further was needed.  The combination of shaver and electrocautery, we gently debrided the subacromial space, and ensure that there is no remaining bleeders.  The incisions were closed with 3-0 nylon.  A sterile dressing was placed along with a sling. The patient was awoken from general anesthesia and taken to the PACU in stable condition without complication.    Post-operative plan:  The patient will be non-weightbearing in a sling. The patient will be discharged home.   DVT prophylaxis with 81 mg of aspirin , until the patient has become fully mobile. Pain control with PRN pain medication preferring oral medicines.   Follow up plan will be scheduled in approximately 10 days for incision check and XR.

## 2023-04-28 NOTE — Discharge Instructions (Addendum)
 Mark A. Onesimo, MD MS [facility] [address] [place],  [place]  [address] Phone: [phone] Fax: [phone]    POST-OPERATIVE INSTRUCTIONS - SHOULDER ARTHROSCOPY  WOUND CARE You may remove the Operative Dressing on Post-Op Day #3 (72hrs after surgery).   Alternatively if you would like you can leave dressing on until follow-up if within 7-8 days but keep it dry. There may be a small amount of fluid/bleeding leaking at the surgical site. This is normal; the shoulder is filled with fluid during the procedure and can leak for 24-48hrs after surgery. You may change/reinforce the bandage as needed.  Use the Cryocuff or Ice as often as possible for the first 7 days, then as needed for pain relief. Always keep a towel, ACE wrap or other barrier between the cooling unit and your skin.  You may shower on Post-Op Day #3. Gently pat the area dry. Do not soak the shoulder in water or submerge it. Keep dry incisions as dry as possible. Do not go swimming in the pool or ocean until 4 weeks after surgery or when otherwise instructed.    EXERCISES/BRACING Sling should be used until follow-up.  You can remove sling for hygiene.    Please continue to ambulate and do not stay sitting or lying for too long. Perform foot and wrist pumps to assist in circulation.  POST-OP MEDICATIONS- Multimodal approach to pain control In general your pain will be controlled with a combination of substances.  Prescriptions unless otherwise discussed are electronically sent to your pharmacy.  This is a carefully made plan we use to minimize narcotic use.    Celebrex  - Anti-inflammatory medication taken on a scheduled basis Acetaminophen  - Non-narcotic pain medicine taken on a scheduled basis  Oxycodone  - This is a strong narcotic, to be used only on an "as needed" basis for pain. Aspirin  81mg  - This medicine is used to minimize the risk of blood clots after surgery. Zofran  - take as  needed for nausea  FOLLOW-UP If you develop a Fever (>=101.5), Redness or Drainage from the surgical incision site, please call our office to arrange for an evaluation. Please call the office to schedule a follow-up appointment for your suture removal, 10-14 days post-operatively.    HELPFUL INFORMATION  If you had a block, it will wear off between 8-24 hrs postop typically.  This is period when your pain may go from nearly zero to the pain you would have had postop without the block.  This is an abrupt transition but nothing dangerous is happening.  You may take an extra dose of narcotic when this happens.  You may be more comfortable sleeping in a semi-seated position the first few nights following surgery.  Keep a pillow propped under the elbow and forearm for comfort.  If you have a recliner type of chair it might be beneficial.  If not that is fine too, but it would be helpful to sleep propped up with pillows behind your operated shoulder as well under your elbow and forearm.  This will reduce pulling on the suture lines.  When dressing, put your operative arm in the sleeve first.  When getting undressed, take your operative arm out last.  Loose fitting, button-down shirts are recommended.  Often in the first days after surgery you may be more comfortable keeping your operative arm under your shirt and not through the sleeve.  You may return to work/school in the next couple of days when you  feel up to it.  Desk work and typing in the sling is     fine.  We suggest you use the pain medication the first night prior to going to bed, in order to ease any pain when the anesthesia wears off. You should avoid taking pain medications on an empty stomach as it will make you nauseous.  You should wean off your narcotic medicines as soon as you are able.  Most patients will be off or using minimal narcotics before their first postop appointment.   Do not drink alcoholic beverages or take illicit  drugs when taking pain medications.  It is against the law to drive while taking narcotics.  In some states it is against the law to drive while your arm is in a sling.   Pain medication may make you constipated.  Below are a few solutions to try in this order: Decrease the amount of pain medication if you aren't having pain. Drink lots of decaffeinated fluids. Drink prune juice and/or eat dried prunes  If the first 3 don't work start with additional solutions Take Colace - an over-the-counter stool softener Take Senokot - an over-the-counter laxative Take Miralax  - a stronger over-the-counter laxative

## 2023-04-28 NOTE — Anesthesia Procedure Notes (Addendum)
 Anesthesia Regional Block: Interscalene brachial plexus block   Pre-Anesthetic Checklist: , timeout performed,  Correct Patient, Correct Site, Correct Laterality,  Correct Procedure, Correct Position, site marked,  Risks and benefits discussed,  Surgical consent,  Pre-op evaluation,  At surgeon's request and post-op pain management  Laterality: Right  Prep: chloraprep       Needles:  Injection technique: Single-shot  Needle Type: Echogenic Needle     Needle Length: 4cm  Needle Gauge: 20   Needle insertion depth: 3 cm   Additional Needles:   Procedures:,,,, ultrasound used (permanent image in chart),,    Narrative:  Start time: 04/28/2023 10:35 AM End time: 04/28/2023 10:40 AM Injection made incrementally with aspirations every 5 mL.  Performed by: Personally  Anesthesiologist: Landry Dunnings, MD  Additional Notes: 20 cc 0.5% Ropivicaine injected

## 2023-04-28 NOTE — Anesthesia Postprocedure Evaluation (Signed)
 Anesthesia Post Note  Patient: Nathan Williamson  Procedure(s) Performed: SHOULDER ARTHROSCOPY WITH DISTAL CLAVICLE RESECTION (Right: Shoulder)  Patient location during evaluation: PACU Anesthesia Type: General Level of consciousness: awake and alert Pain management: pain level controlled Vital Signs Assessment: post-procedure vital signs reviewed and stable Respiratory status: spontaneous breathing, nonlabored ventilation, respiratory function stable and patient connected to nasal cannula oxygen Cardiovascular status: blood pressure returned to baseline and stable Postop Assessment: no apparent nausea or vomiting Anesthetic complications: no   There were no known notable events for this encounter.   Last Vitals:  Vitals:   04/28/23 1332 04/28/23 1342  BP: 101/69   Pulse: 62 62  Resp: 15 16  Temp:  36.5 C  SpO2: 96% 100%    Last Pain:  Vitals:   04/28/23 1342  TempSrc: Oral  PainSc: 0-No pain                 Silas Sedam L Keith Felten

## 2023-04-28 NOTE — Anesthesia Preprocedure Evaluation (Signed)
 Anesthesia Evaluation  Patient identified by MRN, date of birth, ID band Patient awake    Reviewed: Allergy & Precautions, H&P , NPO status , Patient's Chart, lab work & pertinent test results, reviewed documented beta blocker date and time   Airway Mallampati: II  TM Distance: >3 FB Neck ROM: full    Dental no notable dental hx. (+) Dental Advisory Given, Teeth Intact   Pulmonary neg pulmonary ROS, former smoker   Pulmonary exam normal breath sounds clear to auscultation       Cardiovascular Exercise Tolerance: Good negative cardio ROS Normal cardiovascular exam Rhythm:regular Rate:Normal     Neuro/Psych negative neurological ROS  negative psych ROS   GI/Hepatic negative GI ROS, Neg liver ROS,,,  Endo/Other  negative endocrine ROS    Renal/GU negative Renal ROS  negative genitourinary   Musculoskeletal   Abdominal Normal abdominal exam  (+)   Peds  Hematology negative hematology ROS (+)   Anesthesia Other Findings History ITP.  History MRSA.  History embolism/ blood clot  Reproductive/Obstetrics negative OB ROS                             Anesthesia Physical Anesthesia Plan  ASA: 2  Anesthesia Plan: General   Post-op Pain Management: Regional block* and Dilaudid  IV   Induction:   PONV Risk Score and Plan: Ondansetron , Dexamethasone  and Midazolam   Airway Management Planned: Oral ETT  Additional Equipment: None  Intra-op Plan:   Post-operative Plan: Extubation in OR  Informed Consent: I have reviewed the patients History and Physical, chart, labs and discussed the procedure including the risks, benefits and alternatives for the proposed anesthesia with the patient or authorized representative who has indicated his/her understanding and acceptance.     Dental Advisory Given  Plan Discussed with: CRNA  Anesthesia Plan Comments:         Anesthesia Quick  Evaluation

## 2023-04-30 ENCOUNTER — Encounter (HOSPITAL_COMMUNITY): Payer: Self-pay | Admitting: Orthopedic Surgery

## 2023-05-01 ENCOUNTER — Encounter (HOSPITAL_COMMUNITY): Payer: Self-pay | Admitting: Orthopedic Surgery

## 2023-05-07 ENCOUNTER — Encounter: Payer: Self-pay | Admitting: Orthopedic Surgery

## 2023-05-07 ENCOUNTER — Ambulatory Visit (INDEPENDENT_AMBULATORY_CARE_PROVIDER_SITE_OTHER): Payer: BC Managed Care – PPO | Admitting: Orthopedic Surgery

## 2023-05-07 VITALS — BP 131/82 | HR 80

## 2023-05-07 DIAGNOSIS — M19011 Primary osteoarthritis, right shoulder: Secondary | ICD-10-CM

## 2023-05-07 MED ORDER — OXYCODONE HCL 5 MG PO TABS: 5.0000 mg | ORAL_TABLET | Freq: Four times a day (QID) | ORAL | 0 refills | Status: AC | PRN

## 2023-05-07 NOTE — Progress Notes (Signed)
Orthopaedic Postop Note  Assessment: Nathan Williamson is a 38 y.o. male s/p right shoulder arthroscopy, subacromial decompression and distal clavicle excision  DOS: 04/28/2023  Plan: Sutures were removed, Steri-Strips placed. We reviewed radiographs in clinic Okay to come out of the sling Focus on range of motion for the next 2 weeks.  No lifting anything heavier than a gallon of milk. In 2 weeks, start advancing your lifting and activities. Follow-up in 4 weeks 1 final refill of oxycodone was provided.  Meds ordered this encounter  Medications   oxyCODONE (ROXICODONE) 5 MG immediate release tablet    Sig: Take 1 tablet (5 mg total) by mouth every 6 (six) hours as needed for up to 7 days.    Dispense:  20 tablet    Refill:  0     Follow-up: Return in about 4 weeks (around 06/04/2023). XR at next visit: None  Subjective:  Chief Complaint  Patient presents with   Routine Post Op    Right DOS 04/28/23    History of Present Illness: Nathan Williamson is a 38 y.o. male who presents following the above stated procedure.  Surgery was approximately 10 days ago.  He is doing well.  His pain is improving.  His motion is improving.  He continues to use the sling.  He states he would sleep without the sling on.  He has some stiffness when he tries to move it.  The cold weather has caused some aching in his shoulder.  No numbness or tingling.  Review of Systems: No fevers or chills No numbness or tingling No Chest Pain No shortness of breath   Objective: BP 131/82   Pulse 80   Physical Exam:  Alert and oriented.  No acute distress.  Surgical incisions of the right shoulder are healing.  No surrounding erythema or drainage.  Mild tenderness to palpation directly over the [facility] joint.  160 degrees of forward flexion, with some obvious discomfort.  Internal rotation to his back pocket, with some discomfort.  Sensation intact in the axillary nerve distribution.  Sensation intact throughout  the right hand.  IMAGING: I personally ordered and reviewed the following images:  No new imaging obtained today.   Oliver Barre, MD 05/07/2023 10:31 AM

## 2023-05-07 NOTE — Patient Instructions (Signed)
Focus on range of motion for the next 2 weeks.  Do not lift anything heavier than a gallon of milk.  In 2 weeks, start advancing your lifting, without restrictions.

## 2023-05-19 ENCOUNTER — Telehealth: Payer: Self-pay | Admitting: Orthopedic Surgery

## 2023-05-19 NOTE — Telephone Encounter (Signed)
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 [phone]

## 2023-05-19 NOTE — Telephone Encounter (Signed)
Returned the pt's call, lvm for him to call me back.

## 2023-05-20 ENCOUNTER — Encounter: Payer: Self-pay | Admitting: Orthopedic Surgery

## 2023-06-04 ENCOUNTER — Encounter: Payer: Self-pay | Admitting: Orthopedic Surgery

## 2023-06-04 ENCOUNTER — Ambulatory Visit (INDEPENDENT_AMBULATORY_CARE_PROVIDER_SITE_OTHER): Payer: BC Managed Care – PPO | Admitting: Orthopedic Surgery

## 2023-06-04 DIAGNOSIS — M19011 Primary osteoarthritis, right shoulder: Secondary | ICD-10-CM

## 2023-06-04 MED ORDER — CELECOXIB 100 MG PO CAPS: 100.0000 mg | ORAL_CAPSULE | Freq: Two times a day (BID) | ORAL | 0 refills | Status: AC

## 2023-06-04 NOTE — Progress Notes (Signed)
 Orthopaedic Postop Note  Assessment: Nathan Williamson is a 38 y.o. male s/p right shoulder arthroscopy, subacromial decompression and distal clavicle excision  DOS: 04/28/2023  Plan: Nathan Williamson is getting better.  He continues to have some pain.  He is not ready to return to work, as he is unable to lift heavy objects.  Realistically, this is a 2-31-month full recovery.  I would like for him to work with a physical therapist in order to improve the swelling, pain and strength overall.  He states his understanding.  I provided him with a prescription for Celebrex.  We have given him a note for work, she is to remain out of work until I see him in approximately 1 month.   Follow-up: Return in about 6 weeks (around 07/16/2023). XR at next visit: None  Subjective:  Chief Complaint  Patient presents with   Routine Post Op    R DOS 04/28/23    History of Present Illness: Nathan Williamson is a 38 y.o. male who presents following the above stated procedure.  Surgery was approximately 5 weeks ago.  He is improving.  He does continue to have soreness.  He is not ready to return to work.  He has good motion, but continues to require some medications.  He is no longer taking oxycodone.  No issues with the surgical incisions.  Review of Systems: No fevers or chills No numbness or tingling No Chest Pain No shortness of breath   Objective: There were no vitals taken for this visit.  Physical Exam:  Alert and oriented.  No acute distress.  Surgical incisions are healed.  No surrounding erythema or drainage.  He has some mild tenderness to palpation over the [facility] joint.  170 degrees of forward flexion.  Internal rotation to T12.  External rotation at the side is similar to the contralateral side.  Fingers are warm well-perfused.  IMAGING: I personally ordered and reviewed the following images:  No new imaging obtained today.   Oliver Barre, MD 06/04/2023 10:07 AM

## 2023-06-04 NOTE — Patient Instructions (Signed)
 Referral to PT  Note for work - out until the next visit

## 2023-07-02 ENCOUNTER — Encounter: Payer: Self-pay | Admitting: Orthopedic Surgery

## 2023-07-02 ENCOUNTER — Ambulatory Visit (INDEPENDENT_AMBULATORY_CARE_PROVIDER_SITE_OTHER): Payer: BC Managed Care – PPO | Admitting: Orthopedic Surgery

## 2023-07-02 DIAGNOSIS — G8929 Other chronic pain: Secondary | ICD-10-CM

## 2023-07-02 DIAGNOSIS — M25511 Pain in right shoulder: Secondary | ICD-10-CM

## 2023-07-02 DIAGNOSIS — M19011 Primary osteoarthritis, right shoulder: Secondary | ICD-10-CM

## 2023-07-02 NOTE — Patient Instructions (Signed)
 Out of work - out until the next visit in 4 weeks

## 2023-07-02 NOTE — Progress Notes (Unsigned)
 Orthopaedic Postop Note  Assessment: Nathan Williamson is a 38 y.o. male s/p right shoulder arthroscopy, subacromial decompression and distal clavicle excision  DOS: 04/28/2023  Plan: Nathan Williamson is getting better.  He continues to have some pain.  He is not ready to return to work, as he is unable to lift heavy objects.  Realistically, this is a 2-8-month full recovery.  I would like for him to work with a physical therapist in order to improve the swelling, pain and strength overall.  He states his understanding.  I provided him with a prescription for Celebrex.  We have given him a note for work, she is to remain out of work until I see him in approximately 1 month.   Follow-up: No follow-ups on file. XR at next visit: None  Subjective:  Chief Complaint  Patient presents with   Routine Post Op    R SA DOS 04/28/23    History of Present Illness: Nathan Williamson is a 38 y.o. male who presents following the above stated procedure.  Surgery was approximately 5 weeks ago.  He is improving.  He does continue to have soreness.  He is not ready to return to work.  He has good motion, but continues to require some medications.  He is no longer taking oxycodone.  No issues with the surgical incisions.  Review of Systems: No fevers or chills No numbness or tingling No Chest Pain No shortness of breath   Objective: There were no vitals taken for this visit.  Physical Exam:  Alert and oriented.  No acute distress.  Surgical incisions are healed.  No surrounding erythema or drainage.  He has some mild tenderness to palpation over the [facility] joint.  170 degrees of forward flexion.  Internal rotation to T12.  External rotation at the side is similar to the contralateral side.  Fingers are warm well-perfused.  IMAGING: I personally ordered and reviewed the following images:  No new imaging obtained today.   Oliver Barre, MD 07/02/2023 1:58 PM

## 2023-07-03 ENCOUNTER — Encounter: Payer: Self-pay | Admitting: Orthopedic Surgery

## 2023-07-09 ENCOUNTER — Other Ambulatory Visit: Payer: Self-pay

## 2023-07-09 ENCOUNTER — Ambulatory Visit (HOSPITAL_COMMUNITY): Payer: Self-pay | Attending: Orthopedic Surgery | Admitting: Occupational Therapy

## 2023-07-09 ENCOUNTER — Encounter (HOSPITAL_COMMUNITY): Payer: Self-pay | Admitting: Occupational Therapy

## 2023-07-09 DIAGNOSIS — M19011 Primary osteoarthritis, right shoulder: Secondary | ICD-10-CM | POA: Insufficient documentation

## 2023-07-09 DIAGNOSIS — G8929 Other chronic pain: Secondary | ICD-10-CM | POA: Insufficient documentation

## 2023-07-09 DIAGNOSIS — M25511 Pain in right shoulder: Secondary | ICD-10-CM | POA: Insufficient documentation

## 2023-07-09 DIAGNOSIS — R29898 Other symptoms and signs involving the musculoskeletal system: Secondary | ICD-10-CM | POA: Insufficient documentation

## 2023-07-09 NOTE — Patient Instructions (Signed)
 Strengthening exercises: complete 10-15X each, 1-2X/day   Theraband strengthening  1) Shoulder protraction  Anchor band in doorway, stand with back to door. Push your hand forward as much as you can to bringing your shoulder blades forward on your rib cage.      2) Shoulder horizontal abduction  Standing with a theraband anchored at chest height, begin with arm straight and some tension in the band. Move your arm out to your side (keeping straight the whole time). Bring the affected arm back to midline.     3) Shoulder Internal Rotation  While holding an elastic band at your side with your elbow bent, start with your hand away from your stomach, then pull the band towards your stomach. Keep your elbow near your side the entire time.     4) Shoulder External Rotation  While holding an elastic band at your side with your elbow bent, start with your hand near your stomach and then pull the band away. Keep your elbow at your side the entire time.     5) Shoulder flexion  While standing with back to the door, holding Theraband at hand level, raise arm in front of you.  Keep elbow straight through entire movement.      6) Shoulder abduction  While holding an elastic band at your side, draw up your arm to the side keeping your elbow straight.    7) (Home) Extension: Isometric / Bilateral Arm Retraction - Sitting   Facing anchor, hold hands and elbow at shoulder height, with elbow bent.  Pull arms back to squeeze shoulder blades together. Repeat 10-15 times. 1-3 times/day.   8) (Clinic) Extension / Flexion (Assist)   Face anchor, pull arms back, keeping elbow straight, and squeze shoulder blades together. Repeat 10-15 times. 1-3 times/day.   Copyright  [employer]. All rights reserved.   9) (Home) Retraction: Row - Bilateral (Anchor)   Facing anchor, arms reaching forward, pull hands toward stomach, keeping elbows bent and at your sides and pinching shoulder blades  together. Repeat 10-15 times. 1-3 times/day.   Copyright  [employer]. All rights reserved.    Complete strengthening exercises using free weights, beginning with 2-3# increasing as able.   1) Shoulder Protraction    Begin with elbows by your side, slowly "punch" straight out in front of you.      2) Shoulder Flexion  Standing:         Begin with arms at your side with thumbs pointed up, slowly raise both arms up and forward towards overhead.               3) Horizontal abduction/adduction   Standing:           Begin with arms straight out in front of you, bring out to the side in at "T" shape. Keep arms straight entire time.                 4) Internal & External Rotation  Standing:     Stand with elbows at the side and elbows bent 90 degrees. Move your forearms away from your body, then bring back inward toward the body.     5) Shoulder Abduction  Standing:       Lying on your back begin with your arms flat on the table next to your side. Slowly move your arms out to the side so that they go overhead, in a jumping jack or snow angel movement.

## 2023-07-09 NOTE — Therapy (Signed)
 OUTPATIENT OCCUPATIONAL THERAPY ORTHO EVALUATION  Patient Name: Nathan Williamson MRN: [id number] DOB:[birth year], 38 y.o., male Today's Date: 07/09/2023   END OF SESSION:  OT End of Session - 07/09/23 1053     Visit Number 1    Number of Visits 1    Date for OT Re-Evaluation 07/10/23    Authorization Type self-pay    OT Start Time 1016    OT Stop Time 1047    OT Time Calculation (min) 31 min    Activity Tolerance Patient tolerated treatment well    Behavior During Therapy WFL for tasks assessed/performed             Past Medical History:  Diagnosis Date   Embolism - blood clot    ITP (idiopathic thrombocytopenic purpura)    MRSA infection (methicillin-resistant Staphylococcus aureus) 2009   Thyroid disease    Past Surgical History:  Procedure Laterality Date   NO PAST SURGERIES     SHOULDER ARTHROSCOPY WITH DISTAL CLAVICLE RESECTION Right 04/28/2023   Procedure: SHOULDER ARTHROSCOPY WITH DISTAL CLAVICLE RESECTION;  Surgeon: Oliver Barre, MD;  Location: [facility];  Service: Orthopedics;  Laterality: Right;   Patient Active Problem List   Diagnosis Date Noted   Chest pain 12/09/2010   Cardiac enzymes elevated 12/09/2010   Bradycardia 12/09/2010    PCP: Dr. Assunta Found REFERRING PROVIDER: Dr. Thane Edu  ONSET DATE: 04/28/23  REFERRING DIAG:  M19.011 (ICD-10-CM) - Arthritis of right acromioclavicular joint  M25.511,G89.29 (ICD-10-CM) - Chronic right shoulder pain    THERAPY DIAG:  Acute pain of right shoulder  Other symptoms and signs involving the musculoskeletal system  Rationale for Evaluation and Treatment: Rehabilitation  SUBJECTIVE:   SUBJECTIVE STATEMENT: S: "It has it's days." Pt accompanied by: self  PERTINENT HISTORY: Pt is a 38 y/o male s/p right shoulder arthroscopic decompression and distal clavicle excision on 04/28/23. Pt reports he has been completing exercises on his own due to limited time to come for formal rehab initially after surgery.    PRECAUTIONS: Shoulder A/ROM and progress as tolerated  WEIGHT BEARING RESTRICTIONS: No  PAIN:  Are you having pain? Yes: NPRS scale: 5/10 Pain location: right shoulder Pain description: sore Aggravating factors: laying on the left side Relieving factors: heat  FALLS: Has patient fallen in last 6 months? No  PLOF: Independent  PATIENT GOALS: To have less pain and use the RUE as normal   NEXT MD VISIT: 07/25/23  OBJECTIVE:   HAND DOMINANCE: Right  ADLs: Overall ADLs: Pt reports difficulty when sleeping on the left side, feels like something is pushing in his right shoulder. Pt has difficulty reaching overhead for an extended time. Pt is unable to lift heavy items at this time. Pt required to perform lifting on a daily basis, anywhere from 35-50# at a time. Pt required to do a scooping/pouring movement daily at work.    FUNCTIONAL OUTCOME MEASURES: Upper Extremity Functional Scale (UEFS): 73/80  UPPER EXTREMITY ROM:       Assessed in sitting, er/IR adducted  Active ROM Right eval  Shoulder flexion 170  Shoulder abduction 165  Shoulder internal rotation 90  Shoulder external rotation 74  (Blank rows = not tested)    UPPER EXTREMITY MMT:     Assessed in sitting, er/IR adducted  MMT Right eval  Shoulder flexion 5/5  Shoulder abduction 5/5  Shoulder internal rotation 5/5  Shoulder external rotation 5/5  (Blank rows = not tested)  HAND FUNCTION: Grip strength: Right: 120 lbs;  Left: 110 lbs  COGNITION: Overall cognitive status: Within functional limits for tasks assessed   TODAY'S TREATMENT:                                                                                                                              DATE:  Eval -Scapular theraband: blue-retraction, extension, row, 10 reps -Theraband strengthening: blue-protraction, flexion, abduction, horizontal abduction, er, 10 reps    PATIENT EDUCATION: Education details: Blue theraband-shoulder and  scapular strengthening; shoulder strengthening with free weights Person educated: Patient Education method: Explanation, Demonstration, and Handouts Education comprehension: verbalized understanding and returned demonstration  HOME EXERCISE PROGRAM: Eval: Blue theraband-shoulder and scapular strengthening; shoulder strengthening with free weights  GOALS: Goals reviewed with patient? Yes   SHORT TERM GOALS: Target date: 07/09/23  Pt will be provided with and educated on HEP to improve mobility and strength in RUE required for use during ADL completion.   Goal status: MET   ASSESSMENT:  CLINICAL IMPRESSION: Patient is a 38 y.o. male who was seen today for occupational therapy evaluation s/p right shoulder arthroscopic debridement and clavicle excision. Pt presents with pain and decreased activity tolerance compared to baseline, is using RUE as dominant and does not have difficulty with ADL completion. Pt demonstrating ROM and strength WNL, reports fatigue when working overhead for an extended amount of time and has not been completing heavy lifting tasks. Pt agreeable to strengthening HEP, as formal OT services are not indicated at this time. Pt educated on strengthening/stability HEP, along with when to progress to personal workout. Pt verbalized understanding.   PERFORMANCE DEFICITS: in functional skills including in functional skills including ADLs, IADLs, coordination, tone, ROM, strength, pain, fascial restrictions, muscle spasms, and UE functional use  IMPAIRMENTS: are limiting patient from ADLs, IADLs, and work.   COMORBIDITIES: has no other co-morbidities that affects occupational performance. Patient will benefit from skilled OT to address above impairments and improve overall function.  MODIFICATION OR ASSISTANCE TO COMPLETE EVALUATION: No modification of tasks or assist necessary to complete an evaluation.  OT OCCUPATIONAL PROFILE AND HISTORY: Problem focused assessment:  Including review of records relating to presenting problem.  CLINICAL DECISION MAKING: LOW - limited treatment options, no task modification necessary  REHAB POTENTIAL: Good  EVALUATION COMPLEXITY: Low      PLAN:  OT FREQUENCY: one time visit  OT DURATION: 1 week  PLANNED INTERVENTIONS: 97168 OT Re-evaluation, 97110 therapeutic exercise, and patient/family education  RECOMMENDED OTHER SERVICES: None at this time  CONSULTED AND AGREED WITH PLAN OF CARE: Patient  PLAN FOR NEXT SESSION: N/A-HEP only   Ezra Sites, OTR/L  [phone] 07/09/2023, 10:53 AM

## 2023-07-25 ENCOUNTER — Ambulatory Visit: Payer: Self-pay | Admitting: Orthopedic Surgery

## 2023-07-25 ENCOUNTER — Encounter: Payer: Self-pay | Admitting: Orthopedic Surgery

## 2023-07-25 DIAGNOSIS — M19011 Primary osteoarthritis, right shoulder: Secondary | ICD-10-CM

## 2023-07-25 NOTE — Progress Notes (Signed)
 Orthopaedic Postop Note  Assessment: Nathan Williamson is a 38 y.o. male s/p right shoulder arthroscopy, subacromial decompression and distal clavicle excision  DOS: 04/28/2023  Plan: [name] has recovered following surgery.  He has full range of motion.  Full strength.  Mild discomfort at the [facility] joint.  At this point, he can return to work, and other activities without restrictions.  He states understanding.  If he has any further issues, he will contact the clinic.  Follow-up: Return if symptoms worsen or fail to improve. XR at next visit: None  Subjective:  Chief Complaint  Patient presents with   Shoulder Pain    Still has right shoulder pain S/P Arthroscopy 04/28/23 but has improved / not as bad     History of Present Illness: Nathan Williamson is a 38 y.o. male who presents following the above stated procedure.  Surgery was approximately 3 months ago.  He feels much better.  Occasional pain, which he states is not bad.  No numbness or tingling.  He feels ready to return to work.  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  Review of Systems: No fevers or chills No numbness or tingling No Chest Pain No shortness of breath   Objective: There were no vitals taken for this visit.  Physical Exam:  Alert and oriented.  No acute distress.  Surgical incisions are healed.  No surrounding erythema or drainage.  Mild tenderness to palpation over the [facility] joint.  Mild prominence in this area.  He has full forward flexion, as well as abduction.  Internal rotation to lumbar spine.  5/5 strength throughout the right shoulder.   IMAGING: I personally ordered and reviewed the following images:  No new imaging obtained today.   Oliver Barre, MD 07/25/2023 11:47 AM

## 2023-07-25 NOTE — Patient Instructions (Signed)
 Note for work. -He has recovered well following surgery.  Okay to return to work.  No restrictions at this time.
